# Patient Record
Sex: Male | Born: 1960
Health system: Southern US, Community
[De-identification: ages and names within clinical notes are randomized; demographics above are authoritative.]

## PROBLEM LIST (undated history)

## (undated) DIAGNOSIS — M79606 Pain in leg, unspecified: Secondary | ICD-10-CM

## (undated) DIAGNOSIS — R079 Chest pain, unspecified: Secondary | ICD-10-CM

## (undated) DIAGNOSIS — I10 Essential (primary) hypertension: Secondary | ICD-10-CM

## (undated) DIAGNOSIS — E079 Disorder of thyroid, unspecified: Secondary | ICD-10-CM

## (undated) DIAGNOSIS — E785 Hyperlipidemia, unspecified: Secondary | ICD-10-CM

## (undated) HISTORY — DX: Essential (primary) hypertension: I10

## (undated) HISTORY — PX: KNEE SURGERY: SHX244

## (undated) HISTORY — DX: Hyperlipidemia, unspecified: E78.5

## (undated) HISTORY — DX: Chest pain, unspecified: R07.9

## (undated) HISTORY — DX: Disorder of thyroid, unspecified: E07.9

## (undated) HISTORY — DX: Pain in leg, unspecified: M79.606

---

## 1997-12-25 ENCOUNTER — Other Ambulatory Visit: Admission: RE | Admit: 1997-12-25 | Discharge: 1997-12-25 | Payer: Self-pay | Admitting: Internal Medicine

## 1998-01-09 ENCOUNTER — Other Ambulatory Visit: Admission: RE | Admit: 1998-01-09 | Discharge: 1998-01-09 | Payer: Self-pay | Admitting: *Deleted

## 2005-05-04 ENCOUNTER — Ambulatory Visit: Payer: Self-pay | Admitting: Internal Medicine

## 2005-05-20 ENCOUNTER — Ambulatory Visit: Payer: Self-pay | Admitting: Internal Medicine

## 2005-05-20 ENCOUNTER — Encounter (INDEPENDENT_AMBULATORY_CARE_PROVIDER_SITE_OTHER): Payer: Self-pay | Admitting: *Deleted

## 2006-04-24 ENCOUNTER — Observation Stay (HOSPITAL_COMMUNITY): Admission: EM | Admit: 2006-04-24 | Discharge: 2006-04-24 | Payer: Self-pay | Admitting: Emergency Medicine

## 2009-04-14 ENCOUNTER — Emergency Department (HOSPITAL_COMMUNITY): Admission: EM | Admit: 2009-04-14 | Discharge: 2009-04-14 | Payer: Self-pay | Admitting: Emergency Medicine

## 2010-09-14 ENCOUNTER — Encounter (INDEPENDENT_AMBULATORY_CARE_PROVIDER_SITE_OTHER): Payer: 59 | Admitting: Internal Medicine

## 2010-09-14 DIAGNOSIS — R079 Chest pain, unspecified: Secondary | ICD-10-CM

## 2010-09-14 NOTE — Progress Notes (Deleted)
This encounter was created in error - please disregard.

## 2010-09-14 NOTE — Progress Notes (Signed)
Exercise Treadmill Test  Pre-Exercise Testing Evaluation Rhythm: normal sinus  Rate: 65   PR:  .14 QRS:  .09  QT:  .40 QTc: .42     Test  Exercise Tolerance Test Ordering MD: Lewayne Bunting, MD  Interpreting MD:  Lewayne Bunting, MD  Unique Test No: 1  Treadmill:  1  Indication for ETT: chest pain - rule out ischemia  Contraindication to ETT: No   Stress Modality: exercise - treadmill  Cardiac Imaging Performed: non   Protocol: standard Bruce - maximal  Max BP:  219/85  Max MPHR (bpm):  173 85% MPR (bpm):  147  MPHR obtained (bpm): 155 % MPHR obtained:  89  Reached 85% MPHR (min:sec):  10:20 Total Exercise Time (min-sec):  12:00  Workload in METS:  13.4 Borg Scale: 13  Reason ETT Terminated:  desired heart rate attained    ST Segment Analysis At Rest: normal ST segments - no evidence of significant ST depression With Exercise: no evidence of significant ST depression  Other Information Arrhythmia:  No Angina during ETT:  absent (0) Quality of ETT:  diagnostic  ETT Interpretation:  normal - no evidence of ischemia by ST analysis  Comments: Hypertensive at baseline  Recommendations:

## 2010-09-15 ENCOUNTER — Other Ambulatory Visit: Payer: 59

## 2010-09-15 NOTE — Progress Notes (Deleted)
This encounter was created in error - please disregard.

## 2010-09-22 ENCOUNTER — Telehealth: Payer: Self-pay | Admitting: *Deleted

## 2010-09-22 ENCOUNTER — Other Ambulatory Visit: Payer: 59

## 2010-09-22 ENCOUNTER — Other Ambulatory Visit (INDEPENDENT_AMBULATORY_CARE_PROVIDER_SITE_OTHER): Payer: 59

## 2010-09-22 DIAGNOSIS — R079 Chest pain, unspecified: Secondary | ICD-10-CM

## 2010-09-22 DIAGNOSIS — I10 Essential (primary) hypertension: Secondary | ICD-10-CM

## 2010-09-22 LAB — LIPID PANEL
Cholesterol: 203 mg/dL — ABNORMAL HIGH (ref 0–200)
HDL: 43.3 mg/dL (ref >39.00)
Total CHOL/HDL Ratio: 5
Triglycerides: 100 mg/dL (ref 0.0–149.0)

## 2010-09-22 MED ORDER — AMLODIPINE BESYLATE 5 MG PO TABS: 5.0000 mg | ORAL_TABLET | Freq: Every day | ORAL | Status: DC

## 2010-09-22 NOTE — Telephone Encounter (Signed)
Dr Ladona Ridgel called me this morning and asked me to call in Amlodipine for patient 5mg 

## 2010-09-23 LAB — LDL CHOLESTEROL, DIRECT: Direct LDL: 141.5 mg/dL

## 2010-09-24 LAB — URINALYSIS, ROUTINE W REFLEX MICROSCOPIC
Bilirubin Urine: NEGATIVE
Hgb urine dipstick: NEGATIVE
Ketones, ur: >80 mg/dL — AB
Nitrite: NEGATIVE
pH: 5.5 (ref 5.0–8.0)

## 2010-10-05 ENCOUNTER — Telehealth: Payer: Self-pay | Admitting: Internal Medicine

## 2010-10-05 NOTE — Telephone Encounter (Signed)
Pt aware of cholesterol results and will discuss with Dr Ladona Ridgel  He first wants to try diet as he already exercises

## 2010-10-05 NOTE — Telephone Encounter (Signed)
Pt calling re test results. Pt states he returning the call

## 2010-11-06 NOTE — Op Note (Signed)
NAME:  William Mcmillan, William Mcmillan               ACCOUNT NO.:  [id number]   MEDICAL RECORD NO.:  [id number]          PATIENT TYPE:  OBV   LOCATION:  2550                         FACILITY:  [facility]   PHYSICIAN:  Artist Pais. Weingold, M.D.DATE OF BIRTH:  Dec 22, 1960   DATE OF PROCEDURE:  04/24/2006  DATE OF DISCHARGE:  04/24/2006                                 OPERATIVE REPORT   PREOPERATIVE DIAGNOSIS:  Left knee chain saw laceration.   POSTOPERATIVE DIAGNOSIS:  Left knee chain saw laceration.   PROCEDURE:  Incision and drainage, evacuation of hematoma, repair of medial  retinaculum.   SURGEON:  Artist Pais. Mina Marble, M.D.   ASSISTANT:  None.   ANESTHESIA:  General.   TOURNIQUET TIME:  12 minutes.   COMPLICATIONS:  None.   DRAINS:  None.   DESCRIPTION OF PROCEDURE:  The patient was taken to the operative suite  after the induction of general anesthesia.  Left leg was prepped and draped  in the usual sterile fashion.  At this point in time, laceration was closed  previously in my office 24 hours prior, was opened due to a large hematoma  and the hematoma was evacuated.  Visualization revealed some violation of  the patellar periosteum on the supermedial aspect as well as some laceration  of the medial aspect of the quadriceps musculature in the same area.  There  was no significant active arterial bleeding noted. The wound as thoroughly  irrigated with 3 liters of normal saline. Any devitalized tissue was  debrided.  After this was done, the leg was exsanguinated, the tourniquet  was inflated to 300 mmHg and the vastus medialis musculature and the small  periosteal defect over the patella was repaired using 0 Vicryl and figure-of-  eight sutures times 5.  After this was done, a drain was placed into the  wound exiting to the medial skin.  The wound was then closed in layers of 2-  0 undyed Vicryl and 3-0 nylon on the skin.  Xeroform 4 x 4s, and fluffs and  compressive dressing and a knee  immobilizer were applied.  The patient  tolerated the procedure well and went to recovery in stable fashion.      Artist Pais Mina Marble, M.D.  Electronically Signed     MAW/MEDQ  D:  04/24/2006  T:  04/25/2006  Job:  [id number]

## 2011-05-19 ENCOUNTER — Telehealth: Payer: Self-pay | Admitting: *Deleted

## 2011-05-19 DIAGNOSIS — E7800 Pure hypercholesterolemia, unspecified: Secondary | ICD-10-CM

## 2011-05-19 MED ORDER — ATORVASTATIN CALCIUM 10 MG PO TABS: 10.0000 mg | ORAL_TABLET | Freq: Every day | ORAL | Status: DC

## 2011-05-19 NOTE — Telephone Encounter (Signed)
Dr Ladona Ridgel called this morning and asked that I call in a Rx of this patient  He is also to have fasting lab work in 6 weeks  Spoke with patient and he is aware of the above  Will have labs drawn on 06/30/10 at 8:30am

## 2011-07-01 ENCOUNTER — Other Ambulatory Visit: Payer: 59 | Admitting: *Deleted

## 2011-07-21 ENCOUNTER — Ambulatory Visit (INDEPENDENT_AMBULATORY_CARE_PROVIDER_SITE_OTHER): Payer: BC Managed Care – PPO | Admitting: *Deleted

## 2011-07-21 DIAGNOSIS — E7800 Pure hypercholesterolemia, unspecified: Secondary | ICD-10-CM

## 2011-07-21 LAB — TSH: TSH: 0.09 u[IU]/mL — ABNORMAL LOW (ref 0.35–5.50)

## 2011-07-21 LAB — LIPID PANEL
Cholesterol: 151 mg/dL (ref 0–200)
HDL: 36.6 mg/dL — ABNORMAL LOW (ref >39.00)
VLDL: 27.8 mg/dL (ref 0.0–40.0)

## 2011-07-21 LAB — HEPATIC FUNCTION PANEL
Albumin: 4 g/dL (ref 3.5–5.2)
Total Protein: 6.6 g/dL (ref 6.0–8.3)

## 2011-10-05 ENCOUNTER — Other Ambulatory Visit: Payer: Self-pay | Admitting: Internal Medicine

## 2011-10-11 ENCOUNTER — Other Ambulatory Visit: Payer: Self-pay | Admitting: Internal Medicine

## 2011-10-23 ENCOUNTER — Other Ambulatory Visit: Payer: Self-pay | Admitting: Internal Medicine

## 2011-10-26 ENCOUNTER — Other Ambulatory Visit: Payer: Self-pay | Admitting: Internal Medicine

## 2011-10-28 ENCOUNTER — Other Ambulatory Visit: Payer: Self-pay | Admitting: Internal Medicine

## 2011-10-28 MED ORDER — ATORVASTATIN CALCIUM 10 MG PO TABS: 10.0000 mg | ORAL_TABLET | Freq: Every day | ORAL | Status: DC

## 2011-10-28 MED ORDER — AMLODIPINE BESYLATE 5 MG PO TABS: 5.0000 mg | ORAL_TABLET | Freq: Every day | ORAL | Status: DC

## 2011-10-28 NOTE — Telephone Encounter (Signed)
Pt needs refill of atorvastatin 10mg , amlodipine 5mg  [facility] 90 days

## 2011-11-01 ENCOUNTER — Other Ambulatory Visit: Payer: Self-pay | Admitting: *Deleted

## 2011-11-01 MED ORDER — AMLODIPINE BESYLATE 5 MG PO TABS: 5.0000 mg | ORAL_TABLET | Freq: Every day | ORAL | Status: DC

## 2011-11-01 MED ORDER — ATORVASTATIN CALCIUM 10 MG PO TABS: 10.0000 mg | ORAL_TABLET | Freq: Every day | ORAL | Status: DC

## 2011-11-01 NOTE — Telephone Encounter (Signed)
Refill completed.

## 2012-05-07 ENCOUNTER — Other Ambulatory Visit: Payer: Self-pay | Admitting: Internal Medicine

## 2012-07-09 ENCOUNTER — Other Ambulatory Visit: Payer: Self-pay | Admitting: Internal Medicine

## 2012-11-24 ENCOUNTER — Ambulatory Visit: Payer: Self-pay | Admitting: Internal Medicine

## 2012-11-24 ENCOUNTER — Telehealth: Payer: Self-pay

## 2012-11-24 ENCOUNTER — Encounter: Payer: Self-pay | Admitting: Internal Medicine

## 2012-11-24 ENCOUNTER — Ambulatory Visit (INDEPENDENT_AMBULATORY_CARE_PROVIDER_SITE_OTHER): Payer: BC Managed Care – PPO | Admitting: Internal Medicine

## 2012-11-24 VITALS — BP 120/82 | HR 80 | Ht 69.0 in | Wt 152.8 lb

## 2012-11-24 DIAGNOSIS — R0789 Other chest pain: Secondary | ICD-10-CM

## 2012-11-24 DIAGNOSIS — M79606 Pain in leg, unspecified: Secondary | ICD-10-CM

## 2012-11-24 DIAGNOSIS — R079 Chest pain, unspecified: Secondary | ICD-10-CM

## 2012-11-24 DIAGNOSIS — M79605 Pain in left leg: Secondary | ICD-10-CM

## 2012-11-24 DIAGNOSIS — M79609 Pain in unspecified limb: Secondary | ICD-10-CM

## 2012-11-24 DIAGNOSIS — E785 Hyperlipidemia, unspecified: Secondary | ICD-10-CM | POA: Insufficient documentation

## 2012-11-24 DIAGNOSIS — I10 Essential (primary) hypertension: Secondary | ICD-10-CM

## 2012-11-24 HISTORY — DX: Essential (primary) hypertension: I10

## 2012-11-24 HISTORY — DX: Hyperlipidemia, unspecified: E78.5

## 2012-11-24 HISTORY — DX: Chest pain, unspecified: R07.9

## 2012-11-24 HISTORY — DX: Pain in leg, unspecified: M79.606

## 2012-11-24 NOTE — Assessment & Plan Note (Signed)
The patient will continue his statin therapy.

## 2012-11-24 NOTE — Progress Notes (Signed)
HPI William Mcmillan returns today for followup. He is a 52 year old man with a history of hypertension, dyslipidemia, and thyroid dysfunction. He has a family history of heart disease and had a negative stress test several years ago. 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 A cow stepped on his calf.  A large swollen area developed. He did not seek medical attention. Over the last 2 weeks, the area of swelling in his left lower leg has migrated downward into the area of the ankle such that his ankle remains swollen. Approximately 2 days ago, he began to experience intermittent chest tightness. He notes that he is actually more sore and tight. He does not appear to be pleuritic in nature. 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 He has not had syncope. No relationship to position. He denies fevers or chills. No Known Allergies   Current Outpatient Prescriptions  Medication Sig Dispense Refill  . amLODipine (NORVASC) 5 MG tablet TAKE 1 TABLET (5 MG TOTAL) BY MOUTH DAILY.  30 tablet  1  . atorvastatin (LIPITOR) 10 MG tablet TAKE 1 TABLET (10 MG TOTAL) BY MOUTH DAILY.  30 tablet  1  . levothyroxine (SYNTHROID, LEVOTHROID) 100 MCG tablet Take 100 mcg by mouth. Takes 100 mg 2 tablets daily a week.      . levothyroxine (SYNTHROID, LEVOTHROID) 88 MCG tablet Take 88 mcg by mouth. Takes 88 mcg daily five times a week.      Marland Kitchen liothyronine (CYTOMEL) 25 MCG tablet Take 12.5 mcg by mouth daily.       No current facility-administered medications for this visit.     Past Medical History  Diagnosis Date  . Hypertension   . Thyroid disease     hypo    ROS:   All systems reviewed and negative except as noted in the HPI.   Past Surgical History  Procedure Laterality Date  . Knee surgery       Family History  Problem Relation Age of Onset  . Heart disease Mother   . Heart disease Father   . Heart attack Father      History    Social History  . Marital Status: Single    Spouse Name: N/A    Number of Children: N/A  . Years of Education: N/A   Occupational History  . Not on file.   Social History Main Topics  . Smoking status: Never Smoker   . Smokeless tobacco: Not on file  . Alcohol Use: No     Comment: occassional  . Drug Use: No  . Sexually Active: Not on file   Other Topics Concern  . Not on file   Social History Narrative  . No narrative on file     BP 120/82  Pulse 80  Ht 5\' 9"  (1.753 m)  Wt 152 lb 12 oz (69.287 kg)  BMI 22.55 kg/m2  Physical Exam:  Well appearing middle-aged man,NAD HEENT: Unremarkable Neck:  No JVD, no thyromegally Back:  No CVA tenderness Lungs:  Clear with no wheezes, rales, or rhonchi. HEART:  Regular rate rhythm, no murmurs, no rubs, no clicks Abd:  soft, positive bowel sounds, no organomegally, no rebound, no guarding Ext:  2 plus pulses, the left lower leg is swollen with minimal erythema. Skin:  No rashes no nodules Neuro:  CN II through XII intact, motor grossly intact  EKG - normal sinus rhythm with rightward axis  and right atrial enlargement. No old ECGs to compare.   Assess/Plan:

## 2012-11-24 NOTE — Addendum Note (Signed)
Addended by: Sabino Snipes E on: 11/24/2012 02:30 PM   Modules accepted: Orders

## 2012-11-24 NOTE — Telephone Encounter (Signed)
Per U/S, negative for dvt Per CT, "small indeterminate nodule in RUL, otherwise negative for PE" Dr. Ladona Ridgel was informed He will inform pt

## 2012-11-24 NOTE — Assessment & Plan Note (Signed)
The patient has very atypical chest pain. We will plan to undergo CT angiography to rule out an occult pulmonary embolism. His ECG shows a rightward axis which would be consistent with this diagnosis. On the other hand, he is not short of breath. He is either a pulmonary embolism or DVT is found, I would anticipate initiation of anticoagulation.

## 2012-11-24 NOTE — Telephone Encounter (Signed)
I made Dr. Ladona Ridgel aware of appts at [facility] this PM at 3:00 pm He will see pt at 1:30 then have pt go to [facility] for testing

## 2012-11-24 NOTE — Assessment & Plan Note (Signed)
The patient has residual swelling in his left lower leg which tracks all the way down to his ankle which is also swollen. There is minimal if any tenderness. I am concerned about a DVT. Will undertake left leg ultrasound to rule out DVT secondary to prior left leg trauma and swelling.

## 2012-11-24 NOTE — Telephone Encounter (Signed)
"  schedule pt for CT chest with contrast and venous U/S of LE for dx:sob and leg pain.  He is to see [place] in clinic at 1:30 pm today" V.O.Dr. Barbette Reichmann, RN, BSN

## 2012-11-24 NOTE — Patient Instructions (Addendum)
Go to [facility] for ultrasound of left leg and CT chest   Your physician wants you to follow-up as needed with Dr. Ladona Ridgel. You will receive a reminder letter in the mail two months in advance. If you don't receive a letter, please call our office to schedule the follow-up appointment.

## 2013-08-14 ENCOUNTER — Other Ambulatory Visit: Payer: Self-pay | Admitting: Internal Medicine

## 2013-10-30 ENCOUNTER — Other Ambulatory Visit: Payer: Self-pay

## 2013-10-30 MED ORDER — AMLODIPINE BESYLATE 5 MG PO TABS: ORAL_TABLET | ORAL | Status: DC

## 2013-10-30 MED ORDER — ATORVASTATIN CALCIUM 10 MG PO TABS: ORAL_TABLET | ORAL | Status: DC

## 2014-10-29 ENCOUNTER — Other Ambulatory Visit: Payer: Self-pay | Admitting: Internal Medicine

## 2014-10-30 ENCOUNTER — Other Ambulatory Visit: Payer: Self-pay | Admitting: *Deleted

## 2014-10-30 MED ORDER — AMLODIPINE BESYLATE 5 MG PO TABS: ORAL_TABLET | ORAL | Status: DC

## 2014-11-07 ENCOUNTER — Other Ambulatory Visit: Payer: Self-pay | Admitting: Internal Medicine

## 2015-01-09 ENCOUNTER — Other Ambulatory Visit: Payer: Self-pay | Admitting: Internal Medicine

## 2015-02-06 ENCOUNTER — Other Ambulatory Visit: Payer: Self-pay | Admitting: Internal Medicine

## 2015-02-07 ENCOUNTER — Other Ambulatory Visit: Payer: Self-pay

## 2015-02-19 ENCOUNTER — Encounter: Payer: Self-pay | Admitting: Internal Medicine

## 2015-02-25 ENCOUNTER — Other Ambulatory Visit: Payer: Self-pay | Admitting: *Deleted

## 2015-02-25 DIAGNOSIS — I10 Essential (primary) hypertension: Secondary | ICD-10-CM

## 2015-02-25 DIAGNOSIS — E785 Hyperlipidemia, unspecified: Secondary | ICD-10-CM

## 2015-02-25 MED ORDER — ATORVASTATIN CALCIUM 10 MG PO TABS: 10.0000 mg | ORAL_TABLET | Freq: Every day | ORAL | Status: DC

## 2015-02-25 MED ORDER — AMLODIPINE BESYLATE 5 MG PO TABS: ORAL_TABLET | ORAL | Status: DC

## 2015-02-25 NOTE — Telephone Encounter (Signed)
Dr Ladona Ridgel has okayed to fill.  Patient to come in for labs soon

## 2015-02-27 ENCOUNTER — Other Ambulatory Visit (INDEPENDENT_AMBULATORY_CARE_PROVIDER_SITE_OTHER): Payer: Managed Care, Other (non HMO) | Admitting: *Deleted

## 2015-02-27 DIAGNOSIS — E785 Hyperlipidemia, unspecified: Secondary | ICD-10-CM | POA: Diagnosis not present

## 2015-02-27 DIAGNOSIS — I10 Essential (primary) hypertension: Secondary | ICD-10-CM | POA: Diagnosis not present

## 2015-02-27 LAB — BASIC METABOLIC PANEL
BUN: 20 mg/dL (ref 6–23)
CHLORIDE: 102 meq/L (ref 96–112)
CO2: 31 meq/L (ref 19–32)
CREATININE: 1.41 mg/dL (ref 0.40–1.50)
Calcium: 9.9 mg/dL (ref 8.4–10.5)
GFR: 56.16 mL/min — ABNORMAL LOW (ref >60.00)
Glucose, Bld: 104 mg/dL — ABNORMAL HIGH (ref 70–99)
POTASSIUM: 4.6 meq/L (ref 3.5–5.1)
SODIUM: 139 meq/L (ref 135–145)

## 2015-02-27 LAB — HEPATIC FUNCTION PANEL
ALBUMIN: 4.5 g/dL (ref 3.5–5.2)
ALT: 23 U/L (ref 0–53)
AST: 27 U/L (ref 0–37)
Alkaline Phosphatase: 72 U/L (ref 39–117)
Bilirubin, Direct: 0.1 mg/dL (ref 0.0–0.3)
Total Bilirubin: 0.7 mg/dL (ref 0.2–1.2)
Total Protein: 7.1 g/dL (ref 6.0–8.3)

## 2015-02-27 LAB — LIPID PANEL
CHOLESTEROL: 185 mg/dL (ref 0–200)
HDL: 51.7 mg/dL (ref >39.00)
LDL Cholesterol: 106 mg/dL — ABNORMAL HIGH (ref 0–99)
NonHDL: 133.6
Total CHOL/HDL Ratio: 4
Triglycerides: 139 mg/dL (ref 0.0–149.0)
VLDL: 27.8 mg/dL (ref 0.0–40.0)

## 2015-02-28 ENCOUNTER — Other Ambulatory Visit: Payer: Self-pay

## 2015-02-28 MED ORDER — AMLODIPINE BESYLATE 5 MG PO TABS: 5.0000 mg | ORAL_TABLET | Freq: Every day | ORAL | Status: DC

## 2015-02-28 MED ORDER — ATORVASTATIN CALCIUM 10 MG PO TABS: 10.0000 mg | ORAL_TABLET | Freq: Every day | ORAL | Status: DC

## 2016-03-01 ENCOUNTER — Other Ambulatory Visit: Payer: Self-pay | Admitting: Internal Medicine

## 2016-03-03 NOTE — Telephone Encounter (Signed)
Send to PCP.

## 2016-03-03 NOTE — Telephone Encounter (Signed)
Deliah BostonKelly F Lanier, RN routed conversation to You 30 minutes ago (4:08 PM)    You  Deliah BostonKelly F Lanier, RN 3 hours ago (12:51 PM)    PRN PT WITH DR Ladona RidgelAYLOR, HE HASNT BEEN SEEN SINCE 2014, WANTS ATORVASTATIN, SHOULD HE GO TO HIS PCP? PLEASE ADVISE, THANKS  (Routing comment)     Deliah BostonKelly F Lanier, RN routed conversation to You 5 hours ago (11:12 AM)    Deliah BostonKelly F Lanier, RN 5 hours ago (11:12 AM)    Send to PCP   Documentation     You  Deliah BostonKelly F Lanier, RN Yesterday (11:09 AM)    ASKING FOR LIPITOR REFILL, THEY HAVENT BEEN SEEN IN 3 YEARS, I KNOW THEY ARE PRN BUT WOULDN'T THEY STILL NEED BLOOD WORK ETC? PLEASE ADVISE (Routing comment)     Interface, Surescripts Out routed [facility] to [facility] 2 days

## 2016-03-08 ENCOUNTER — Other Ambulatory Visit: Payer: Self-pay | Admitting: Internal Medicine

## 2016-03-08 NOTE — Telephone Encounter (Signed)
Please send to PCP as he has not been seen in 3 years

## 2016-03-09 ENCOUNTER — Other Ambulatory Visit: Payer: Self-pay | Admitting: *Deleted

## 2016-03-09 MED ORDER — ATORVASTATIN CALCIUM 10 MG PO TABS: 10.0000 mg | ORAL_TABLET | Freq: Every day | ORAL | 3 refills | Status: DC

## 2016-05-15 ENCOUNTER — Other Ambulatory Visit: Payer: Self-pay | Admitting: Internal Medicine

## 2016-09-02 ENCOUNTER — Encounter: Payer: Self-pay | Admitting: Internal Medicine

## 2016-09-08 ENCOUNTER — Ambulatory Visit (INDEPENDENT_AMBULATORY_CARE_PROVIDER_SITE_OTHER)
Admission: RE | Admit: 2016-09-08 | Discharge: 2016-09-08 | Disposition: A | Discharge status: Home / Self Care | Payer: Managed Care, Other (non HMO) | Source: Ambulatory Visit | Attending: Internal Medicine | Admitting: Internal Medicine

## 2016-09-08 ENCOUNTER — Ambulatory Visit (INDEPENDENT_AMBULATORY_CARE_PROVIDER_SITE_OTHER): Payer: Managed Care, Other (non HMO) | Admitting: Internal Medicine

## 2016-09-08 ENCOUNTER — Encounter: Payer: Self-pay | Admitting: Internal Medicine

## 2016-09-08 ENCOUNTER — Encounter (INDEPENDENT_AMBULATORY_CARE_PROVIDER_SITE_OTHER): Payer: Self-pay

## 2016-09-08 VITALS — BP 132/84 | HR 60 | Ht 69.0 in | Wt 165.4 lb

## 2016-09-08 DIAGNOSIS — I10 Essential (primary) hypertension: Secondary | ICD-10-CM

## 2016-09-08 DIAGNOSIS — R079 Chest pain, unspecified: Secondary | ICD-10-CM

## 2016-09-08 DIAGNOSIS — E785 Hyperlipidemia, unspecified: Secondary | ICD-10-CM

## 2016-09-08 NOTE — Progress Notes (Signed)
HPI [name] returns today for followup. He is a 56 year old man with a history of hypertension, dyslipidemia, and thyroid dysfunction. He has a family history of heart disease and had a negative stress test several years ago. He has non-cardiac chest pain.  He has not had syncope. No relationship to position. He denies fevers or chills. He has a father with CAD, s/p CABG and a brother with prior MI/s/p stenting.  No Known Allergies   Current Outpatient Prescriptions  Medication Sig Dispense Refill  . amLODipine (NORVASC) 5 MG tablet TAKE 1 TABLET (5 MG TOTAL) BY MOUTH DAILY. 90 tablet 1  . atorvastatin (LIPITOR) 10 MG tablet Take 1 tablet (10 mg total) by mouth daily. 90 tablet 3  . levothyroxine (SYNTHROID, LEVOTHROID) 100 MCG tablet Take 100 mcg by mouth. Takes 100 mg 2 tablets daily a week.    . levothyroxine (SYNTHROID, LEVOTHROID) 88 MCG tablet Take 88 mcg by mouth. Takes 88 mcg daily five times a week.    Marland Kitchen. liothyronine (CYTOMEL) 25 MCG tablet Take 12.5 mcg by mouth daily.     No current facility-administered medications for this visit.      Past Medical History:  Diagnosis Date  . Hypertension   . Thyroid disease    hypo    ROS:   All systems reviewed and negative except as noted in the HPI.   Past Surgical History:  Procedure Laterality Date  . KNEE SURGERY       Family History  Problem Relation Age of Onset  . Heart disease Mother   . Heart disease Father   . Heart attack Father      Social History   Social History  . Marital status: Single    Spouse name: N/A  . Number of children: N/A  . Years of education: N/A   Occupational History  . Not on file.   Social History Main Topics  . Smoking status: Former Smoker    Packs/day: 0.50    Years: 2.00    Types: Cigarettes    Quit date: 06/21/1980  . Smokeless tobacco: Former NeurosurgeonUser    Types: Snuff    Quit date: 06/21/1980  . Alcohol use No     Comment: occassional  . Drug use: No  . Sexual activity:  Not on file   Other Topics Concern  . Not on file   Social History Narrative  . No narrative on file     BP 132/84   Pulse 60   Ht 5\' 9"  (1.753 m)   Wt 165 lb 6.4 oz (75 kg)   SpO2 98%   BMI 24.43 kg/m   Physical Exam:  Well appearing middle-aged man,NAD HEENT: Unremarkable Neck:  6 cm JVD, no thyromegally Back:  No CVA tenderness Lungs:  Clear with no wheezes, rales, or rhonchi. HEART:  Regular rate rhythm, no murmurs, no rubs, no clicks Abd:  soft, positive bowel sounds, no organomegally, no rebound, no guarding Ext:  2 plus pulses, the left lower leg is swollen with minimal erythema. Skin:  No rashes no nodules Neuro:  CN II through XII intact, motor grossly intact  EKG - normal sinus rhythm with rightward axis and non-specific STT changes.  Assess/Plan: 1. Non-cardiac chest pain with multiple cardiac risk factors - will have the patient undergo coronary calcium score to quantitate his coronary calcium. No additional recs pending the result of the CT scan 2. HTN - his blood pressure is mostly well controlled with amlodipine.  3. Dyslipidemia -  His fasting lipids have not been check in a couple of years. Will recheck in the next week or so.  Leonia Reeves.D.

## 2016-09-08 NOTE — Patient Instructions (Addendum)
Medication Instructions:  Your physician recommends that you continue on your current medications as directed. Please refer to the Current Medication list given to you today.   Labwork: None orered  Testing/Procedures: Dr. Ladona Ridgelaylor wants you to have CT Calcium Scoring to be done at the [address] location.  Follow-Up: Based on results of the CT Calcium Scoring results.   Any Other Special Instructions Will Be Listed Below (If Applicable).     If you need a refill on your cardiac medications before your next appointment, please call your pharmacy.

## 2016-09-13 ENCOUNTER — Other Ambulatory Visit: Payer: Self-pay | Admitting: *Deleted

## 2016-09-13 DIAGNOSIS — E785 Hyperlipidemia, unspecified: Secondary | ICD-10-CM

## 2016-09-13 DIAGNOSIS — R079 Chest pain, unspecified: Secondary | ICD-10-CM

## 2016-09-14 ENCOUNTER — Other Ambulatory Visit: Payer: Managed Care, Other (non HMO)

## 2016-09-14 ENCOUNTER — Ambulatory Visit: Payer: Managed Care, Other (non HMO)

## 2016-09-14 DIAGNOSIS — E785 Hyperlipidemia, unspecified: Secondary | ICD-10-CM

## 2016-09-14 DIAGNOSIS — R079 Chest pain, unspecified: Secondary | ICD-10-CM

## 2016-09-14 LAB — EXERCISE TOLERANCE TEST
CHL CUP STRESS STAGE 1 DBP: 84 mmHg
CHL CUP STRESS STAGE 1 GRADE: 0 %
CHL CUP STRESS STAGE 1 HR: 81 {beats}/min
CHL CUP STRESS STAGE 1 SPEED: 0 mph
CHL CUP STRESS STAGE 10 HR: 115 {beats}/min
CHL CUP STRESS STAGE 10 SPEED: 1.5 mph
CHL CUP STRESS STAGE 11 HR: 80 {beats}/min
CHL CUP STRESS STAGE 2 HR: 82 {beats}/min
CHL CUP STRESS STAGE 3 GRADE: 0 %
CHL CUP STRESS STAGE 3 SPEED: 1 mph
CHL CUP STRESS STAGE 4 SPEED: 1 mph
CHL CUP STRESS STAGE 5 GRADE: 10 %
CHL CUP STRESS STAGE 5 HR: 82 {beats}/min
CHL CUP STRESS STAGE 6 HR: 104 {beats}/min
CHL CUP STRESS STAGE 6 SBP: 154 mmHg
CHL CUP STRESS STAGE 6 SPEED: 2.5 mph
CHL CUP STRESS STAGE 7 DBP: 75 mmHg
CHL CUP STRESS STAGE 7 HR: 127 {beats}/min
CHL CUP STRESS STAGE 7 SBP: 195 mmHg
CHL CUP STRESS STAGE 8 DBP: 83 mmHg
CHL CUP STRESS STAGE 8 GRADE: 16 %
CHL CUP STRESS STAGE 9 HR: 141 {beats}/min
CHL RATE OF PERCEIVED EXERTION: 13
CSEPED: 12 min
CSEPEDS: 0 s
CSEPEW: 13.4 METS
CSEPHR: 86 %
CSEPPHR: 141 {beats}/min
CSEPPMHR: 84 %
MPHR: 167 {beats}/min
Rest HR: 67 {beats}/min
Stage 1 SBP: 154 mmHg
Stage 10 DBP: 79 mmHg
Stage 10 Grade: 0 %
Stage 10 SBP: 157 mmHg
Stage 11 DBP: 87 mmHg
Stage 11 Grade: 0 %
Stage 11 SBP: 136 mmHg
Stage 11 Speed: 0 mph
Stage 2 Grade: 0 %
Stage 2 Speed: 0 mph
Stage 3 HR: 83 {beats}/min
Stage 4 Grade: 0.1 %
Stage 4 HR: 82 {beats}/min
Stage 5 DBP: 81 mmHg
Stage 5 SBP: 156 mmHg
Stage 5 Speed: 1.7 mph
Stage 6 DBP: 64 mmHg
Stage 6 Grade: 12 %
Stage 7 Grade: 14 %
Stage 7 Speed: 3.4 mph
Stage 8 HR: 141 {beats}/min
Stage 8 SBP: 220 mmHg
Stage 8 Speed: 4.2 mph
Stage 9 Grade: 16 %
Stage 9 Speed: 4.2 mph

## 2016-09-14 LAB — LIPID PANEL
CHOL/HDL RATIO: 3.8 ratio (ref 0.0–5.0)
Cholesterol, Total: 176 mg/dL (ref 100–199)
HDL: 46 mg/dL (ref >39)
LDL Calculated: 98 mg/dL (ref 0–99)
TRIGLYCERIDES: 158 mg/dL — AB (ref 0–149)
VLDL Cholesterol Cal: 32 mg/dL (ref 5–40)

## 2016-09-15 ENCOUNTER — Ambulatory Visit: Payer: Managed Care, Other (non HMO) | Admitting: Internal Medicine

## 2016-09-15 NOTE — Addendum Note (Signed)
Addended by: Jama FlavorsMANNING, Malyah Ohlrich L on: 09/15/2016 10:55 AM   Modules accepted: Orders

## 2016-09-20 ENCOUNTER — Telehealth: Payer: Self-pay | Admitting: *Deleted

## 2016-09-20 MED ORDER — ATORVASTATIN CALCIUM 20 MG PO TABS: 20.0000 mg | ORAL_TABLET | Freq: Every day | ORAL | 6 refills | Status: DC

## 2016-09-20 MED ORDER — AMLODIPINE BESYLATE 5 MG PO TABS: 5.0000 mg | ORAL_TABLET | Freq: Every day | ORAL | 1 refills | Status: DC

## 2016-09-20 NOTE — Telephone Encounter (Signed)
Reviewed results with patient who verbalized understanding. Rx sent to [facility] per pt request.

## 2016-09-20 NOTE — Telephone Encounter (Signed)
-----   Message from Marinus Maw, MD sent at 09/16/2016 10:34 PM EDT ----- Ask patient to increase his lipitor to 20 mg daily when he gets back from being out of town. GT ----- Message ----- From: Nell Range Lab Results In Sent: 09/14/2016   4:40 PM To: Marinus Maw, MD

## 2016-09-20 NOTE — Addendum Note (Signed)
Addended by: Baird Lyons on: 09/20/2016 01:30 PM   Modules accepted: Orders

## 2016-09-21 ENCOUNTER — Encounter: Payer: Managed Care, Other (non HMO) | Admitting: Internal Medicine

## 2016-09-29 ENCOUNTER — Encounter: Payer: Managed Care, Other (non HMO) | Admitting: Physician Assistant

## 2016-09-29 ENCOUNTER — Telehealth: Payer: Self-pay | Admitting: *Deleted

## 2016-09-29 NOTE — Telephone Encounter (Signed)
DISCUSSED  CARDIAC CT FINDINGS WITH  DR  Ladona Ridgel  NO F/U NEEDED AT THIS TIME  BUT  NEED TO  SWITCH  LIPITOR  10 MG TO CRESTOR  20 MG  DAILY  PT ON VACATION  WILL NEED TO  CALL  LATER  WITH  RECOMMENDATIONS .Zack Seal

## 2016-10-26 ENCOUNTER — Telehealth: Payer: Self-pay | Admitting: *Deleted

## 2016-10-26 ENCOUNTER — Telehealth: Payer: Self-pay | Admitting: Internal Medicine

## 2016-10-26 MED ORDER — ROSUVASTATIN CALCIUM 20 MG PO TABS: 20.0000 mg | ORAL_TABLET | Freq: Every day | ORAL | 6 refills | Status: DC

## 2016-10-26 MED ORDER — ATORVASTATIN CALCIUM 20 MG PO TABS: 20.0000 mg | ORAL_TABLET | Freq: Every day | ORAL | 3 refills | Status: DC

## 2016-10-26 NOTE — Telephone Encounter (Signed)
Changed from Lipitor to Crestor, see 09/29/16 phone note

## 2016-10-26 NOTE — Telephone Encounter (Signed)
New message        *STAT* If patient is at the pharmacy, call can be transferred to refill team.   1. Which medications need to be refilled? (please list name of each medication and dose if known) atorvastatin 20mg  (new dosage)  2. Which pharmacy/location (including street and city if local pharmacy) is medication to be sent to? [facility] 3. Do they need a 30 day or 90 day supply? 90 day supply

## 2016-10-26 NOTE — Telephone Encounter (Signed)
Follow up ° °Pt voiced returning nurses call. ° °Please f/u °

## 2016-10-26 NOTE — Telephone Encounter (Signed)
Advised of recommendation. Rx sent to [facility]. Patient verbalized understanding and agreeable to plan.

## 2016-11-14 ENCOUNTER — Other Ambulatory Visit: Payer: Self-pay | Admitting: Internal Medicine

## 2016-11-16 NOTE — Telephone Encounter (Signed)
Medication Detail    Disp Refills Start End   amLODipine (NORVASC) 5 MG tablet 90 tablet 1 09/20/2016    Sig - Route: Take 1 tablet (5 mg total) by mouth daily. - Oral   Notes to Pharmacy: Patient does not need filled at this time.   E-Prescribing Status: Receipt confirmed by pharmacy (09/20/2016 1:27 PM EDT)   Pharmacy   [id number] - [place], [place] - [address]    

## 2016-11-20 ENCOUNTER — Other Ambulatory Visit: Payer: Self-pay | Admitting: Internal Medicine

## 2016-11-22 ENCOUNTER — Telehealth: Payer: Self-pay

## 2016-11-22 MED ORDER — AMLODIPINE BESYLATE 5 MG PO TABS: 5.0000 mg | ORAL_TABLET | Freq: Every day | ORAL | 11 refills | Status: DC

## 2016-11-22 NOTE — Telephone Encounter (Signed)
[facility]mloidpine [facility] [address], per Dr. Lubertha Basqueaylor's order.

## 2016-11-22 NOTE — Telephone Encounter (Signed)
Medication Detail    Disp Refills Start End   amLODipine (NORVASC) 5 MG tablet 90 tablet 1 09/20/2016    Sig - Route: Take 1 tablet (5 mg total) by mouth daily. - Oral   Notes to Pharmacy: Patient does not need filled at this time.   E-Prescribing Status: Receipt confirmed by pharmacy (09/20/2016 1:27 PM EDT)   Pharmacy   [id number] - [place], [place] - [address]

## 2016-12-19 ENCOUNTER — Encounter (HOSPITAL_COMMUNITY): Payer: Self-pay | Admitting: Emergency Medicine

## 2016-12-19 ENCOUNTER — Emergency Department (HOSPITAL_COMMUNITY)
Admission: EM | Admit: 2016-12-19 | Discharge: 2016-12-19 | Disposition: A | Discharge status: Home / Self Care | Payer: Managed Care, Other (non HMO) | Attending: Emergency Medicine | Admitting: Emergency Medicine

## 2016-12-19 ENCOUNTER — Emergency Department (HOSPITAL_COMMUNITY): Payer: Managed Care, Other (non HMO)

## 2016-12-19 DIAGNOSIS — Z79899 Other long term (current) drug therapy: Secondary | ICD-10-CM | POA: Diagnosis not present

## 2016-12-19 DIAGNOSIS — Z87891 Personal history of nicotine dependence: Secondary | ICD-10-CM | POA: Insufficient documentation

## 2016-12-19 DIAGNOSIS — E039 Hypothyroidism, unspecified: Secondary | ICD-10-CM | POA: Insufficient documentation

## 2016-12-19 DIAGNOSIS — I10 Essential (primary) hypertension: Secondary | ICD-10-CM | POA: Insufficient documentation

## 2016-12-19 DIAGNOSIS — N201 Calculus of ureter: Secondary | ICD-10-CM | POA: Diagnosis not present

## 2016-12-19 DIAGNOSIS — R1031 Right lower quadrant pain: Secondary | ICD-10-CM | POA: Diagnosis present

## 2016-12-19 LAB — URINALYSIS, ROUTINE W REFLEX MICROSCOPIC
Bilirubin Urine: NEGATIVE
Glucose, UA: NEGATIVE mg/dL
HGB URINE DIPSTICK: NEGATIVE
Ketones, ur: 20 mg/dL — AB
Leukocytes, UA: NEGATIVE
NITRITE: NEGATIVE
PH: 5 (ref 5.0–8.0)
Protein, ur: NEGATIVE mg/dL
SPECIFIC GRAVITY, URINE: 1.024 (ref 1.005–1.030)

## 2016-12-19 MED ORDER — ONDANSETRON 8 MG PO TBDP: 8.0000 mg | ORAL_TABLET | Freq: Three times a day (TID) | ORAL | 0 refills | Status: DC | PRN

## 2016-12-19 MED ORDER — KETOROLAC TROMETHAMINE 30 MG/ML IJ SOLN
30.0000 mg | Freq: Once | INTRAMUSCULAR | Status: AC
  Administered 2016-12-19: 30 mg via INTRAVENOUS
  Filled 2016-12-19: qty 1

## 2016-12-19 MED ORDER — TAMSULOSIN HCL 0.4 MG PO CAPS: 0.4000 mg | ORAL_CAPSULE | Freq: Every day | ORAL | 0 refills | Status: DC

## 2016-12-19 MED ORDER — HYDROMORPHONE HCL 1 MG/ML IJ SOLN
1.0000 mg | Freq: Once | INTRAMUSCULAR | Status: AC
  Administered 2016-12-19: 1 mg via INTRAVENOUS
  Filled 2016-12-19: qty 1

## 2016-12-19 MED ORDER — OXYCODONE-ACETAMINOPHEN 5-325 MG PO TABS: 1.0000 | ORAL_TABLET | ORAL | 0 refills | Status: DC | PRN

## 2016-12-19 MED ORDER — ONDANSETRON HCL 4 MG/2ML IJ SOLN
4.0000 mg | Freq: Once | INTRAMUSCULAR | Status: AC
  Administered 2016-12-19: 4 mg via INTRAVENOUS
  Filled 2016-12-19: qty 2

## 2016-12-19 MED ORDER — IBUPROFEN 600 MG PO TABS: 600.0000 mg | ORAL_TABLET | Freq: Three times a day (TID) | ORAL | 0 refills | Status: DC | PRN

## 2016-12-19 NOTE — ED Provider Notes (Signed)
[facility]EMERGENCY DEPT Provider Note   CSN: [id number] Arrival date & time: 12/19/16  1913     History   Chief Complaint Chief Complaint  Patient presents with  . Flank Pain    HPI William Mcmillan is a 56 y.o. male.  HPI Patient is a 56 year old male who presents the emergency department with acute severe right flank pain with radiation towards his right groin.  He has a history kidney stones and states this feels similar.  His pain is severe in severity.  Reports nausea without vomiting.  Denies diarrhea.  No fevers or chills.  No urinary complaints.  He was in his normal state health earlier today   Past Medical History:  Diagnosis Date  . Hypertension   . Thyroid disease    hypo    Patient Active Problem List   Diagnosis Date Noted  . Chest pain 11/24/2012  . Leg pain 11/24/2012  . Essential hypertension 11/24/2012  . Dyslipidemia 11/24/2012    Past Surgical History:  Procedure Laterality Date  . KNEE SURGERY         Home Medications    Prior to Admission medications   Medication Sig Start Date End Date Taking? Authorizing Provider  amLODipine (NORVASC) 5 MG tablet Take 1 tablet (5 mg total) by mouth daily. 11/22/16  Yes Marinus Maw, MD  Artificial Tear Ointment (DRY EYES OP) Apply 1 drop to eye as needed (dry eye).   Yes [provider]  levothyroxine (SYNTHROID, LEVOTHROID) 100 MCG tablet Take 100 mcg by mouth. Takes 100 mg 2 tablets daily a week.   Yes [provider]  liothyronine (CYTOMEL) 25 MCG tablet Take 12.5 mcg by mouth daily.   Yes [provider]  rosuvastatin (CRESTOR) 20 MG tablet Take 1 tablet (20 mg total) by mouth daily. 10/26/16  Yes Marinus Maw, MD  ibuprofen (ADVIL,MOTRIN) 600 MG tablet Take 1 tablet (600 mg total) by mouth every 8 (eight) hours as needed. 12/19/16   Azalia Bilis, MD  ondansetron (ZOFRAN ODT) 8 MG disintegrating tablet Take 1 tablet (8 mg total) by mouth every 8 (eight) hours as needed for nausea  or vomiting. 12/19/16   Azalia Bilis, MD  oxyCODONE-acetaminophen (PERCOCET/ROXICET) 5-325 MG tablet Take 1 tablet by mouth every 4 (four) hours as needed for severe pain. 12/19/16   Azalia Bilis, MD  tamsulosin (FLOMAX) 0.4 MG CAPS capsule Take 1 capsule (0.4 mg total) by mouth daily. 12/19/16   Azalia Bilis, MD    Family History Family History  Problem Relation Age of Onset  . Heart disease Mother   . Heart disease Father   . Heart attack Father     Social History Social History  Substance Use Topics  . Smoking status: Former Smoker    Packs/day: 0.50    Years: 2.00    Types: Cigarettes    Quit date: 06/21/1980  . Smokeless tobacco: Former Neurosurgeon    Types: Snuff    Quit date: 06/21/1980  . Alcohol use No     Comment: occassional     Allergies   Patient has no known allergies.   Review of Systems Review of Systems  All other systems reviewed and are negative.    Physical Exam Updated Vital Signs BP (!) 157/91   Pulse 69   Temp 97.6 F (36.4 C) (Oral)   Resp (!) 22   Ht 5\' 9"  (1.753 m)   Wt 70.3 kg (155 lb)   SpO2 99%   BMI 22.89  kg/m   Physical Exam  Constitutional: He is oriented to person, place, and time. He appears well-developed and well-nourished.  Uncomfortable appearing  HENT:  Head: Normocephalic and atraumatic.  Eyes: EOM are normal.  Neck: Normal range of motion.  Cardiovascular: Normal rate and regular rhythm.   Pulmonary/Chest: Effort normal and breath sounds normal. No respiratory distress.  Abdominal: Soft. He exhibits no distension. There is no tenderness.  Musculoskeletal: Normal range of motion.  Neurological: He is alert and oriented to person, place, and time.  Skin: Skin is warm and dry.  Psychiatric: He has a normal mood and affect. Judgment normal.  Nursing note and vitals reviewed.    ED Treatments / Results  Labs (all labs ordered are listed, but only abnormal results are displayed) Labs Reviewed  URINALYSIS, ROUTINE W REFLEX  MICROSCOPIC - Abnormal; Notable for the following:       Result Value   Ketones, ur 20 (*)    All other components within normal limits    EKG  EKG Interpretation None       Radiology Ct Renal Stone Study  Result Date: 12/19/2016 CLINICAL DATA:  Right-sided flank pain and nausea for 1 hour. EXAM: CT ABDOMEN AND PELVIS WITHOUT CONTRAST TECHNIQUE: Multidetector CT imaging of the abdomen and pelvis was performed following the standard protocol without IV contrast. COMPARISON:  Body CT 04/10/2009 FINDINGS: Lower chest: No acute abnormality. Hepatobiliary: No focal liver abnormality is seen. No gallstones, gallbladder wall thickening, or biliary dilatation. Pancreas: Unremarkable. No pancreatic ductal dilatation or surrounding inflammatory changes. Spleen: Normal in size without focal abnormality. Adrenals/Urinary Tract: 3 mm distal right ureteral calculus, slightly upstream to the vesicoureteral junction, causing mild upstream hydroureter and minimal right hydronephrosis. Inflammatory changes in the right renal pelvis. Multiple tiny 1-2 mm nonobstructing calculi within the left kidney. Two tiny nonobstructing calculi within the right kidney. Stomach/Bowel: Stomach is within normal limits. Appendix appears normal. No evidence of bowel wall thickening, distention, or inflammatory changes. Vascular/Lymphatic: No significant vascular findings are present. No enlarged abdominal or pelvic lymph nodes. Reproductive: Prostate is unremarkable. Other: No abdominal wall hernia or abnormality. No abdominopelvic ascites. Musculoskeletal: 11 mm sclerotic focus within L5 vertebral body has slightly enlarged when compared to 2010 CT. IMPRESSION: Right obstructive uropathy caused by 3 mm distal right ureteral calculus. Bilateral nephrolithiasis. Slightly enlarged 11 mm sclerotic focus within L5 vertebral body. In the absence of malignancy capable of producing sclerotic lesions, this likely represents a bone island.  Follow-up with lumbosacral spine radiographs may be considered. Electronically Signed   By: Ted Mcalpineobrinka  Dimitrova M.D.   On: 12/19/2016 20:30    Procedures Procedures (including critical care time)  Medications Ordered in ED Medications  ketorolac (TORADOL) 30 MG/ML injection 30 mg (30 mg Intravenous Given 12/19/16 2008)  HYDROmorphone (DILAUDID) injection 1 mg (1 mg Intravenous Given 12/19/16 2008)  ondansetron (ZOFRAN) injection 4 mg (4 mg Intravenous Given 12/19/16 2008)     Initial Impression / Assessment and Plan / ED Course  I have reviewed the triage vital signs and the nursing notes.  Pertinent labs & imaging results that were available during my care of the patient were reviewed by me and considered in my medical decision making (see chart for details).     Feels much better after acute management in the emergency department.  3 mm right ureteral stone.  Outpatient urology follow-up.  Standard stone precautions.  Pain control.  Patient understands return to the ER for new or worsening symptoms  Final  Clinical Impressions(s) / ED Diagnoses   Final diagnoses:  Right ureteral stone    New Prescriptions Discharge Medication List as of 12/19/2016  9:57 PM    START taking these medications   Details  ibuprofen (ADVIL,MOTRIN) 600 MG tablet Take 1 tablet (600 mg total) by mouth every 8 (eight) hours as needed., Starting Sun 12/19/2016, Print    ondansetron (ZOFRAN ODT) 8 MG disintegrating tablet Take 1 tablet (8 mg total) by mouth every 8 (eight) hours as needed for nausea or vomiting., Starting Sun 12/19/2016, Print    oxyCODONE-acetaminophen (PERCOCET/ROXICET) 5-325 MG tablet Take 1 tablet by mouth every 4 (four) hours as needed for severe pain., Starting Sun 12/19/2016, Print    tamsulosin (FLOMAX) 0.4 MG CAPS capsule Take 1 capsule (0.4 mg total) by mouth daily., Starting Sun 12/19/2016, Print         Azalia Bilis, MD 12/19/16 [phone]

## 2016-12-19 NOTE — ED Triage Notes (Signed)
Pt reports R sided flank pain present X1 hr with nausea. Hx of kidney stone. Pt appears to be in distress, very uncomfortable in triage.

## 2016-12-19 NOTE — ED Notes (Signed)
Patient transported to CT 

## 2017-03-21 ENCOUNTER — Other Ambulatory Visit: Payer: Self-pay

## 2017-03-21 MED ORDER — ROSUVASTATIN CALCIUM 20 MG PO TABS: 20.0000 mg | ORAL_TABLET | Freq: Every day | ORAL | 0 refills | Status: DC

## 2017-03-21 NOTE — Telephone Encounter (Signed)
September 29, 2016        4:51 PM  Alois Cliche, LPN routed this conversation to Baird Lyons, RN  [place], Chief of Staff, LPN      2:13 PM  Note    DISCUSSED  CARDIAC CT FINDINGS WITH  DR  Ladona Ridgel  NO F/U NEEDED AT THIS TIME  BUT  NEED TO  SWITCH  LIPITOR  10 MG TO CRESTOR  20 MG  DAILY  PT ON VACATION  WILL NEED TO  CALL  LATER  WITH  RECOMMENDATIONS ./CY          4:48 PM    [name] contacted Alois Cliche, LPN

## 2017-04-26 DIAGNOSIS — E039 Hypothyroidism, unspecified: Secondary | ICD-10-CM | POA: Diagnosis not present

## 2017-04-29 DIAGNOSIS — R5383 Other fatigue: Secondary | ICD-10-CM | POA: Diagnosis not present

## 2017-04-29 DIAGNOSIS — R630 Anorexia: Secondary | ICD-10-CM | POA: Diagnosis not present

## 2017-05-03 DIAGNOSIS — E039 Hypothyroidism, unspecified: Secondary | ICD-10-CM | POA: Diagnosis not present

## 2017-06-15 ENCOUNTER — Other Ambulatory Visit: Payer: Self-pay

## 2017-06-15 MED ORDER — ROSUVASTATIN CALCIUM 20 MG PO TABS: 20.0000 mg | ORAL_TABLET | Freq: Every day | ORAL | 0 refills | Status: DC

## 2017-09-18 ENCOUNTER — Other Ambulatory Visit: Payer: Self-pay | Admitting: Internal Medicine

## 2017-09-22 ENCOUNTER — Other Ambulatory Visit: Payer: Self-pay | Admitting: Internal Medicine

## 2017-09-22 MED ORDER — AMLODIPINE BESYLATE 5 MG PO TABS: 5.0000 mg | ORAL_TABLET | Freq: Every day | ORAL | 0 refills | Status: DC

## 2017-09-22 MED ORDER — ROSUVASTATIN CALCIUM 20 MG PO TABS: 20.0000 mg | ORAL_TABLET | Freq: Every day | ORAL | 0 refills | Status: DC

## 2017-09-22 NOTE — Telephone Encounter (Signed)
Pt's medication was sent to pt's pharmacy as requested. Confirmation received.  °

## 2017-09-22 NOTE — Telephone Encounter (Signed)
°*  STAT* If patient is at the pharmacy, call can be transferred to refill team.   1. Which medications need to be refilled? (please list name of each medication and dose if known) Amlodipine 5 mg, Rosuvastatin 20 mg  2. Which pharmacy/location (including street and city if local pharmacy) is medication to be sent to? [id number] - [place], [place] - [address]  3. Do they need a 30 day or 90 day supply? [id number]  ' Patient scheduled to see Dr. Ladona Ridgelaylor 12-09-17 @8 :15am

## 2017-09-26 DIAGNOSIS — L218 Other seborrheic dermatitis: Secondary | ICD-10-CM | POA: Diagnosis not present

## 2017-09-26 DIAGNOSIS — L308 Other specified dermatitis: Secondary | ICD-10-CM | POA: Diagnosis not present

## 2017-09-26 DIAGNOSIS — L812 Freckles: Secondary | ICD-10-CM | POA: Diagnosis not present

## 2017-09-26 DIAGNOSIS — L821 Other seborrheic keratosis: Secondary | ICD-10-CM | POA: Diagnosis not present

## 2017-10-05 DIAGNOSIS — H16142 Punctate keratitis, left eye: Secondary | ICD-10-CM | POA: Diagnosis not present

## 2017-12-09 ENCOUNTER — Encounter: Payer: Self-pay | Admitting: Internal Medicine

## 2017-12-09 ENCOUNTER — Ambulatory Visit: Payer: BLUE CROSS/BLUE SHIELD | Admitting: Internal Medicine

## 2017-12-09 ENCOUNTER — Ambulatory Visit: Payer: Managed Care, Other (non HMO) | Admitting: Internal Medicine

## 2017-12-09 VITALS — BP 126/56 | HR 51 | Ht 69.0 in | Wt 157.0 lb

## 2017-12-09 DIAGNOSIS — E785 Hyperlipidemia, unspecified: Secondary | ICD-10-CM | POA: Diagnosis not present

## 2017-12-09 DIAGNOSIS — I10 Essential (primary) hypertension: Secondary | ICD-10-CM | POA: Diagnosis not present

## 2017-12-09 DIAGNOSIS — R079 Chest pain, unspecified: Secondary | ICD-10-CM | POA: Diagnosis not present

## 2017-12-09 LAB — LIPID PANEL
CHOLESTEROL TOTAL: 162 mg/dL (ref 100–199)
Chol/HDL Ratio: 3 ratio (ref 0.0–5.0)
HDL: 54 mg/dL (ref >39)
LDL Calculated: 77 mg/dL (ref 0–99)
Triglycerides: 155 mg/dL — ABNORMAL HIGH (ref 0–149)
VLDL Cholesterol Cal: 31 mg/dL (ref 5–40)

## 2017-12-09 LAB — HEPATIC FUNCTION PANEL
ALK PHOS: 65 IU/L (ref 39–117)
ALT: 22 IU/L (ref 0–44)
AST: 25 IU/L (ref 0–40)
Albumin: 4.5 g/dL (ref 3.5–5.5)
BILIRUBIN TOTAL: 0.6 mg/dL (ref 0.0–1.2)
BILIRUBIN, DIRECT: 0.19 mg/dL (ref 0.00–0.40)
Total Protein: 6.7 g/dL (ref 6.0–8.5)

## 2017-12-09 NOTE — Patient Instructions (Signed)

## 2017-12-09 NOTE — Progress Notes (Signed)
HPI Mr. Siebenaler returns today for followup. He is a 57 year old man with a history of hypertension, dyslipidemia, and thyroid dysfunction. He has a family history of heart disease and had a negative stress test several years ago. He has non-cardiac chest pain.  He has not had syncope. No relationship to position. He denies fevers or chills. He has a father with CAD, s/p CABG and a brother with prior MI/s/p stenting. [place]. He has not had fasting lipids or a liver panel recently.   No Known Allergies   Current Outpatient Medications  Medication Sig Dispense Refill  . amLODipine (NORVASC) 5 MG tablet Take 1 tablet (5 mg total) by mouth daily. Please keep upcoming appt before anymore refills. Thank you 90 tablet 0  . Artificial Tear Ointment (DRY EYES OP) Apply 1 drop to eye as needed (dry eye).    Marland Kitchen ibuprofen (ADVIL,MOTRIN) 600 MG tablet Take 1 tablet (600 mg total) by mouth every 8 (eight) hours as needed. 15 tablet 0  . levothyroxine (SYNTHROID, LEVOTHROID) 100 MCG tablet Take 100 mcg by mouth. Takes 100 mg 2 tablets daily a week.    Marland Kitchen liothyronine (CYTOMEL) 5 MCG tablet Take 10 mcg by mouth daily.     . rosuvastatin (CRESTOR) 20 MG tablet Take 1 tablet (20 mg total) by mouth daily. Please keep upcoming appt before anymore refills. Thank you 90 tablet 0   No current facility-administered medications for this visit.      Past Medical History:  Diagnosis Date  . Chest pain 11/24/2012  . Dyslipidemia 11/24/2012  . Essential hypertension 11/24/2012  . Hypertension   . Leg pain 11/24/2012  . Thyroid disease    hypo    ROS:   All systems reviewed and negative except as noted in the HPI.   Past Surgical History:  Procedure Laterality Date  . KNEE SURGERY       Family History  Problem Relation Age of Onset  . Heart disease Mother   . Heart disease Father   . Heart attack Father      Social History   Socioeconomic History  . Marital  status: Single    Spouse name: Not on file  . Number of children: Not on file  . Years of education: Not on file  . Highest education level: Not on file  Occupational History  . Not on file  Social Needs  . Financial resource strain: Not on file  . Food insecurity:    Worry: Not on file    Inability: Not on file  . Transportation needs:    Medical: Not on file    Non-medical: Not on file  Tobacco Use  . Smoking status: Former Smoker    Packs/day: 0.50    Years: 2.00    Pack years: 1.00    Types: Cigarettes    Last attempt to quit: 06/21/1980    Years since quitting: 37.4  . Smokeless tobacco: Former Neurosurgeon    Types: Snuff    Quit date: 06/21/1980  Substance and Sexual Activity  . Alcohol use: No    Comment: occassional  . Drug use: No  . Sexual activity: Not on file  Lifestyle  . Physical activity:    Days per week: Not on file    Minutes per session: Not on file  . Stress: Not on file  Relationships  . Social connections:    Talks on phone: Not on file  Gets together: Not on file    Attends religious service: Not on file    Active member of club or organization: Not on file    Attends meetings of clubs or organizations: Not on file    Relationship status: Not on file  . Intimate partner violence:    Fear of current or ex partner: Not on file    Emotionally abused: Not on file    Physically abused: Not on file    Forced sexual activity: Not on file  Other Topics Concern  . Not on file  Social History Narrative  . Not on file     BP (!) 126/56   Pulse (!) 51   Ht 5\' 9"  (1.753 m)   Wt 157 lb (71.2 kg)   SpO2 98%   BMI 23.18 kg/m   Physical Exam:  Well appearing NAD HEENT: Unremarkable Neck:  No JVD, no thyromegally Lymphatics:  No adenopathy Back:  No CVA tenderness Lungs:  Clear with no wheezes HEART:  Regular rate rhythm, no murmurs, no rubs, no clicks Abd:  soft, positive bowel sounds, no organomegally, no rebound, no guarding Ext:  2 plus  pulses, no edema, no cyanosis, no clubbing Skin:  No rashes no nodules Neuro:  CN II through XII intact, motor grossly intact  EKG - nsr with nsstt changes   Assess/Plan: 1. Atypical chest pain - non-exertional and quiet at this point.  2. Dyslipidemia - he has not had fasting lipids. He will continue crestor.  3. HTN - he is doing well on amlodipine.   Leonia ReevesGregg Taylor,M.D.

## 2017-12-10 ENCOUNTER — Encounter: Payer: Self-pay | Admitting: Internal Medicine

## 2017-12-12 ENCOUNTER — Other Ambulatory Visit: Payer: Self-pay | Admitting: Internal Medicine

## 2018-01-09 ENCOUNTER — Ambulatory Visit: Payer: Self-pay | Admitting: Internal Medicine

## 2018-03-07 DIAGNOSIS — H10233 Serous conjunctivitis, except viral, bilateral: Secondary | ICD-10-CM | POA: Diagnosis not present

## 2018-04-26 DIAGNOSIS — E039 Hypothyroidism, unspecified: Secondary | ICD-10-CM | POA: Diagnosis not present

## 2018-05-03 DIAGNOSIS — E039 Hypothyroidism, unspecified: Secondary | ICD-10-CM | POA: Diagnosis not present

## 2018-10-02 DIAGNOSIS — L570 Actinic keratosis: Secondary | ICD-10-CM | POA: Diagnosis not present

## 2018-10-02 DIAGNOSIS — L821 Other seborrheic keratosis: Secondary | ICD-10-CM | POA: Diagnosis not present

## 2018-10-02 DIAGNOSIS — L812 Freckles: Secondary | ICD-10-CM | POA: Diagnosis not present

## 2018-10-21 DIAGNOSIS — H1089 Other conjunctivitis: Secondary | ICD-10-CM | POA: Diagnosis not present

## 2018-11-10 ENCOUNTER — Telehealth: Payer: Self-pay | Admitting: Internal Medicine

## 2018-11-10 NOTE — Telephone Encounter (Signed)
New message    Spoke w/pt about appt on 06.23.20 with Dr. Ladona Ridgelaylor. Pt was rescheduled to 05.26.20. Pt smart phone number is listed in appt notes.      Virtual Visit Pre-Appointment Phone Call  "(Name), I am calling you today to discuss your upcoming appointment. We are currently trying to limit exposure to the virus that causes COVID-19 by seeing patients at home rather than in the office."  1. "What is the BEST phone number to call the day of the visit?" - include this in appointment notes  2. Do you have or have access to (through a family member/friend) a smartphone with video capability that we can use for your visit?" a. If yes - list this number in appt notes as cell (if different from BEST phone #) and list the appointment type as a VIDEO visit in appointment notes b. If no - list the appointment type as a PHONE visit in appointment notes  3. Confirm consent - "In the setting of the current Covid19 crisis, you are scheduled for a (phone or video) visit with your provider on (date) at (time).  Just as we do with many in-office visits, in order for you to participate in this visit, we must obtain consent.  If you'd like, I can send this to your mychart (if signed up) or email for you to review.  Otherwise, I can obtain your verbal consent now.  All virtual visits are billed to your insurance company just like a normal visit would be.  By agreeing to a virtual visit, we'd like you to understand that the technology does not allow for your provider to perform an examination, and thus may limit your provider's ability to fully assess your condition. If your provider identifies any concerns that need to be evaluated in person, we will make arrangements to do so.  Finally, though the technology is pretty good, we cannot assure that it will always work on either your or our end, and in the setting of a video visit, we may have to convert it to a phone-only visit.  In either situation, we cannot  ensure that we have a secure connection.  Are you willing to proceed?" STAFF: Did the patient verbally acknowledge consent to telehealth visit? Document YES/NO here: YES  4. Advise patient to be prepared - "Two hours prior to your appointment, go ahead and check your blood pressure, pulse, oxygen saturation, and your weight (if you have the equipment to check those) and write them all down. When your visit starts, your provider will ask you for this information. If you have an Apple Watch or Kardia device, please plan to have heart rate information ready on the day of your appointment. Please have a pen and paper handy nearby the day of the visit as well."  5. Give patient instructions for MyChart download to smartphone OR [email or web address] as below if video visit (depending on what platform provider is using)  6. Inform patient they will receive a phone call 15 minutes prior to their appointment time (may be from unknown caller ID) so they should be prepared to answer    TELEPHONE CALL NOTE  William Mcmillan has been deemed a candidate for a follow-up tele-health visit to limit community exposure during the Covid-19 pandemic. I spoke with the patient via phone to ensure availability of phone/video source, confirm preferred email & phone number, and discuss instructions and expectations.  I reminded William Mcmillan to be prepared with any vital sign and/or  heart rhythm information that could potentially be obtained via home monitoring, at the time of his visit. I reminded William Mcmillan to expect a phone call prior to his visit.  Elyse Hsu 11/10/2018 8:36 AM   INSTRUCTIONS FOR DOWNLOADING THE MYCHART APP TO SMARTPHONE  - The patient must first make sure to have activated MyChart and know their login information - If Apple, go to [employer] and type in MyChart in the search bar and download the app. If Android, ask patient to go to [employer] and type in R[place] in the search bar and  download the app. The app is free but as with any other app downloads, their phone may require them to verify saved payment information or Apple/Android password.  - The patient will need to then log into the app with their MyChart username and password, and select [facility] as their healthcare provider to link the account. When it is time for your visit, go to the MyChart app, find appointments, and click Begin Video Visit. Be sure to Select Allow for your device to access the Microphone and Camera for your visit. You will then be connected, and your provider will be with you shortly.  **If they have any issues connecting, or need assistance please contact MyChart service desk [phone])**  **If using a computer, in order to ensure the best quality for their visit they will need to use either of the following Internet Browsers: [employer] or Google Chrome**  IF USING DOXIMITY or DOXY.ME - The patient will receive a link just prior to their visit by text.     FULL LENGTH CONSENT FOR TELE-HEALTH VISIT   I hereby voluntarily request, consent and authorize [facility] and its employed or contracted physicians, physician assistants, nurse practitioners or other licensed health care professionals (the Practitioner), to provide me with telemedicine health care services (the Services") as deemed necessary by the treating Practitioner. I acknowledge and consent to receive the Services by the Practitioner via telemedicine. I understand that the telemedicine visit will involve communicating with the Practitioner through live audiovisual communication technology and the disclosure of certain medical information by electronic transmission. I acknowledge that I have been given the opportunity to request an in-person assessment or other available alternative prior to the telemedicine visit and am voluntarily participating in the telemedicine visit.  I understand that I have the right to  withhold or withdraw my consent to the use of telemedicine in the course of my care at any time, without affecting my right to future care or treatment, and that the Practitioner or I may terminate the telemedicine visit at any time. I understand that I have the right to inspect all information obtained and/or recorded in the course of the telemedicine visit and may receive copies of available information for a reasonable fee.  I understand that some of the potential risks of receiving the Services via telemedicine include:   Delay or interruption in medical evaluation due to technological equipment failure or disruption;  Information transmitted may not be sufficient (e.g. poor resolution of images) to allow for appropriate medical decision making by the Practitioner; and/or   In rare instances, security protocols could fail, causing a breach of personal health information.  Furthermore, I acknowledge that it is my responsibility to provide information about my medical history, conditions and care that is complete and accurate to the best of my ability. I acknowledge that Practitioner's advice, recommendations, and/or decision may be based on  factors not within their control, such as incomplete or inaccurate data provided by me or distortions of diagnostic images or specimens that may result from electronic transmissions. I understand that the practice of medicine is not an exact science and that Practitioner makes no warranties or guarantees regarding treatment outcomes. I acknowledge that I will receive a copy of this consent concurrently upon execution via email to the email address I last provided but may also request a printed copy by calling the office of [facility].    I understand that my insurance will be billed for this visit.   I have read or had this consent read to me.  I understand the contents of this consent, which adequately explains the benefits and risks of the Services being  provided via telemedicine.   I have been provided ample opportunity to ask questions regarding this consent and the Services and have had my questions answered to my satisfaction.  I give my informed consent for the services to be provided through the use of telemedicine in my medical care  By participating in this telemedicine visit I agree to the above.

## 2018-11-11 ENCOUNTER — Encounter (HOSPITAL_COMMUNITY): Payer: Self-pay

## 2018-11-11 ENCOUNTER — Ambulatory Visit (HOSPITAL_COMMUNITY)
Admission: EM | Admit: 2018-11-11 | Discharge: 2018-11-11 | Disposition: A | Discharge status: Home / Self Care | Payer: BLUE CROSS/BLUE SHIELD | Attending: Family Medicine | Admitting: Family Medicine

## 2018-11-11 ENCOUNTER — Other Ambulatory Visit: Payer: Self-pay

## 2018-11-11 DIAGNOSIS — H109 Unspecified conjunctivitis: Secondary | ICD-10-CM

## 2018-11-11 MED ORDER — OFLOXACIN 0.3 % OP SOLN: 1.0000 [drp] | Freq: Four times a day (QID) | OPHTHALMIC | 0 refills | Status: DC

## 2018-11-11 NOTE — Discharge Instructions (Addendum)
Use eyedrops daily for the next week. Important to avoid contact use for the next week as well. Return if symptoms do not improve/worsen.

## 2018-11-11 NOTE — ED Provider Notes (Signed)
[facility]    CSN: [id number] Arrival date & time: 11/11/18  1048     History   Chief Complaint Chief Complaint  Patient presents with  . Eye Problem    HPI William Mcmillan is a 58 y.o. male presenting for acute concern of left eye swelling and pain that is been worsening for the last 3 days.  Patient states pain is constant: Denies pain with extraocular movements, headache, ear pain, jaw pain.  Patient states that he was treated for bacterial conjunctivitis last month so he began using his TobraDex eyedrops without relief.  She denies fever, chills, malaise.  Of note, patient does wear contacts, is wearing glasses today.    Past Medical History:  Diagnosis Date  . Chest pain 11/24/2012  . Dyslipidemia 11/24/2012  . Essential hypertension 11/24/2012  . Hypertension   . Leg pain 11/24/2012  . Thyroid disease    hypo    Patient Active Problem List   Diagnosis Date Noted  . Chest pain 11/24/2012  . Leg pain 11/24/2012  . Essential hypertension 11/24/2012  . Dyslipidemia 11/24/2012    Past Surgical History:  Procedure Laterality Date  . KNEE SURGERY         Home Medications    Prior to Admission medications   Medication Sig Start Date End Date Taking? Authorizing Provider  amLODipine (NORVASC) 5 MG tablet TAKE 1 TABLET BY MOUTH DAILY. PLEASE KEEP UPCOMING APPT BEFORE ANYMORE REFILLS 12/13/17   Marinus Mawaylor, Gregg W, MD  Artificial Tear Ointment (DRY EYES OP) Apply 1 drop to eye as needed (dry eye).    [provider]  ibuprofen (ADVIL,MOTRIN) 600 MG tablet Take 1 tablet (600 mg total) by mouth every 8 (eight) hours as needed. 12/19/16   Azalia Bilisampos, Kevin, MD  levothyroxine (SYNTHROID, LEVOTHROID) 100 MCG tablet Take 100 mcg by mouth. Takes 100 mg 2 tablets daily a week.    [provider]  liothyronine (CYTOMEL) 5 MCG tablet Take 10 mcg by mouth daily.     [provider]  ofloxacin (OCUFLOX) 0.3 % ophthalmic solution Place 1 drop into the left eye  4 (four) times daily. 11/11/18   Hall-Potvin, [place], PA-C  rosuvastatin (CRESTOR) 20 MG tablet TAKE 1 TABLET BY MOUTH DAILY. PLEASE KEEP UPCOMING APPT BEFORE ANYMORE REFILLS. THANK YOU 12/13/17   Marinus Mawaylor, Gregg W, MD    Family History Family History  Problem Relation Age of Onset  . Heart disease Mother   . Heart disease Father   . Heart attack Father     Social History Social History   Tobacco Use  . Smoking status: Former Smoker    Packs/day: 0.50    Years: 2.00    Pack years: 1.00    Types: Cigarettes    Last attempt to quit: 06/21/1980    Years since quitting: 38.4  . Smokeless tobacco: Former NeurosurgeonUser    Types: Snuff    Quit date: 06/21/1980  Substance Use Topics  . Alcohol use: No    Comment: occassional  . Drug use: No     Allergies   Patient has no known allergies.   Review of Systems Review of Systems  Constitutional: Negative for fatigue and fever.  HENT: Negative for ear pain, sinus pressure and sinus pain.   Eyes: Negative for photophobia, pain, discharge, redness, itching and visual disturbance.       Positive for infraorbital tenderness and swelling  Respiratory: Negative for cough and shortness of breath.   Skin: Negative for  rash and wound.     Physical Exam Triage Vital Signs ED Triage Vitals  Enc Vitals Group     BP 11/11/18 1105 125/82     Pulse Rate 11/11/18 1105 68     Resp 11/11/18 1105 18     Temp 11/11/18 1105 98.1 F (36.7 C)     Temp Source 11/11/18 1105 Oral     SpO2 11/11/18 1105 99 %     Weight 11/11/18 1105 155 lb (70.3 kg)     Height --      Head Circumference --      Peak Flow --      Pain Score 11/11/18 1104 8     Pain Loc --      Pain Edu? --      Excl. in GC? --    No data found.  Updated Vital Signs BP 125/82 (BP Location: Right Arm)   Pulse 68   Temp 98.1 F (36.7 C) (Oral)   Resp 18   Wt 155 lb (70.3 kg)   SpO2 99%   BMI 22.89 kg/m   Visual Acuity Right Eye Distance:   Left Eye Distance:   Bilateral  Distance:    Right Eye Near:   Left Eye Near:    Bilateral Near:     Physical Exam Constitutional:      General: He is not in acute distress.    Appearance: Normal appearance. He is normal weight.  HENT:     Head: Normocephalic and atraumatic.     Right Ear: Tympanic membrane, ear canal and external ear normal.     Left Ear: Tympanic membrane, ear canal and external ear normal.     Nose: Nose normal. No congestion.     Mouth/Throat:     Mouth: Mucous membranes are moist.     Pharynx: Oropharynx is clear. No oropharyngeal exudate or posterior oropharyngeal erythema.  Eyes:     General: Lids are everted, no foreign bodies appreciated. Gaze aligned appropriately. No allergic shiner, visual field deficit or scleral icterus.       Right eye: No foreign body or discharge.        Left eye: No foreign body or discharge.     Extraocular Movements: Extraocular movements intact.     Conjunctiva/sclera:     Right eye: Right conjunctiva is not injected. No exudate.    Left eye: Left conjunctiva is injected. Exudate present.     Comments: Left lower lid with injection, focal edema, and exudate with lid retraction.    Neck:     Musculoskeletal: Neck supple. No muscular tenderness.  Cardiovascular:     Rate and Rhythm: Normal rate and regular rhythm.  Pulmonary:     Effort: Pulmonary effort is normal.     Breath sounds: Normal breath sounds.  Lymphadenopathy:     Cervical: No cervical adenopathy.  Neurological:     Mental Status: He is alert.      UC Treatments / Results  Labs (all labs ordered are listed, but only abnormal results are displayed) Labs Reviewed - No data to display  EKG None  Radiology No results found.  Procedures Procedures (including critical care time)  Medications Ordered in UC Medications - No data to display  Initial Impression / Assessment and Plan / UC Course  I have reviewed the triage vital signs and the nursing notes.  Pertinent labs & imaging  results that were available during my care of the patient were reviewed by me  and considered in my medical decision making (see chart for details).     58 year old male with right eye bacterial conjunctivitis and exudate.  Overall stable, will change antibiotic.  Discussed return precautions, patient verbalized understanding.  Patient is to avoid contact use for the next week. Final Clinical Impressions(s) / UC Diagnoses   Final diagnoses:  Bacterial conjunctivitis of left eye     Discharge Instructions     Use eyedrops daily for the next week. Important to avoid contact use for the next week as well. Return if symptoms do not improve/worsen.    ED Prescriptions    Medication Sig Dispense Auth. Provider   ofloxacin (OCUFLOX) 0.3 % ophthalmic solution Place 1 drop into the left eye 4 (four) times daily. 5 mL Hall-Potvin, [place], PA-C     Controlled Substance Prescriptions [place] Controlled Substance Registry consulted? Not Applicable   Shea Evans, [place] 11/11/18 1453

## 2018-11-11 NOTE — ED Triage Notes (Signed)
Pt cc left eyelid issue the lower lid swelling.  Pt states this has been going on for 4 days, It started on the top and now its on the bottom.

## 2018-11-14 ENCOUNTER — Telehealth (INDEPENDENT_AMBULATORY_CARE_PROVIDER_SITE_OTHER): Payer: BLUE CROSS/BLUE SHIELD | Admitting: Internal Medicine

## 2018-11-14 ENCOUNTER — Other Ambulatory Visit: Payer: Self-pay

## 2018-11-14 DIAGNOSIS — E785 Hyperlipidemia, unspecified: Secondary | ICD-10-CM | POA: Diagnosis not present

## 2018-11-14 DIAGNOSIS — I10 Essential (primary) hypertension: Secondary | ICD-10-CM

## 2018-11-14 NOTE — Progress Notes (Signed)
Electrophysiology TeleHealth Note   Due to national recommendations of social distancing due to COVID 19, an audio/video telehealth visit is felt to be most appropriate for this patient at this time.  See MyChart message from today for the patient's consent to telehealth for [facility].   Date:  11/14/2018   ID:  [name], DOB [birth year], MRN [id number]  Location: patient's home  Provider location: [address] [place]  Evaluation Performed: Follow-up visit  PCP:  Kirby Funk, MD  Cardiologist:  No primary care provider on file. Ladona Ridgel Electrophysiologist:  Dr Ladona Ridgel  Chief Complaint:  "I'm doing alright."  History of Present Illness:    William Mcmillan is a 58 y.o. male who presents via audio/video conferencing for a telehealth visit today.  He has a h/o HTN, dyslipidemia, and thyroid dysfunction. He remains active, running 5 miles 4 times a week. Since last being seen in our clinic, the patient reports doing very well. He has been treated for a sty in his eye. Today, he denies symptoms of palpitations, chest pain, shortness of breath,  lower extremity edema, dizziness, presyncope, or syncope.  The patient is otherwise without complaint today.  The patient denies symptoms of fevers, chills, cough, or new SOB worrisome for COVID 19.  Past Medical History:  Diagnosis Date  . Chest pain 11/24/2012  . Dyslipidemia 11/24/2012  . Essential hypertension 11/24/2012  . Hypertension   . Leg pain 11/24/2012  . Thyroid disease    hypo    Past Surgical History:  Procedure Laterality Date  . KNEE SURGERY      Current Outpatient Medications  Medication Sig Dispense Refill  . amLODipine (NORVASC) 5 MG tablet TAKE 1 TABLET BY MOUTH DAILY. PLEASE KEEP UPCOMING APPT BEFORE ANYMORE REFILLS 90 tablet 3  . Artificial Tear Ointment (DRY EYES OP) Apply 1 drop to eye as needed (dry eye).    Marland Kitchen ibuprofen (ADVIL,MOTRIN) 600 MG tablet Take 1 tablet (600 mg total) by mouth every 8  (eight) hours as needed. 15 tablet 0  . levothyroxine (SYNTHROID, LEVOTHROID) 100 MCG tablet Take 100 mcg by mouth. Takes 100 mg 2 tablets daily a week.    Marland Kitchen liothyronine (CYTOMEL) 5 MCG tablet Take 10 mcg by mouth daily.     Marland Kitchen ofloxacin (OCUFLOX) 0.3 % ophthalmic solution Place 1 drop into the left eye 4 (four) times daily. 5 mL 0  . rosuvastatin (CRESTOR) 20 MG tablet TAKE 1 TABLET BY MOUTH DAILY. PLEASE KEEP UPCOMING APPT BEFORE ANYMORE REFILLS. THANK YOU 90 tablet 3   No current facility-administered medications for this visit.     Allergies:   Patient has no known allergies.   Social History:  The patient  reports that he quit smoking about 38 years ago. His smoking use included cigarettes. He has a 1.00 pack-year smoking history. He quit smokeless tobacco use about 38 years ago.  His smokeless tobacco use included snuff. He reports that he does not drink alcohol or use drugs.   Family History:  The patient's  family history includes Heart attack in his father; Heart disease in his father and mother.   ROS:  Please see the history of present illness.   All other systems are personally reviewed and negative.    Exam:    Vital Signs:    Well appearing, alert and conversant, regular work of breathing,  good skin color Eyes- anicteric, neuro- grossly intact, skin- no apparent rash or lesions or cyanosis, mouth- oral  mucosa is pink   Labs/Other Tests and Data Reviewed:    Recent Labs: 12/09/2017: ALT 22   Wt Readings from Last 3 Encounters:  11/11/18 155 lb (70.3 kg)  12/09/17 157 lb (71.2 kg)  12/19/16 155 lb (70.3 kg)     Other studies personally reviewed:   ASSESSMENT & PLAN:    1.  HTN - his bp at home has been alright. He will continue amlodipine. He is not overweight. He will continue his exercise program. 2. Dyslipidemia - he will continue crestor and we will await fasting lipids.  3. Chest pain - he has had none since his last visit. He will undergo watchful  waiting. 4. COVID 19 screen The patient denies symptoms of COVID 19 at this time.  The importance of social distancing was discussed today.  Follow-up:  12 months Next remote: n/a  Current medicines are reviewed at length with the patient today.   The patient does not have concerns regarding his medicines.  The following changes were made today:  none  Labs/ tests ordered today include: none No orders of the defined types were placed in this encounter.    Patient Risk:  after full review of this patients clinical status, I feel that they are at moderate risk at this time.  Today, I have spent 15 minutes with the patient with telehealth technology discussing all of the above.    Signed, Lewayne BuntingGregg Elif Yonts, MD  11/14/2018 2:45 PM     [facility] [address] [place] [address] [phone] (office) [phone] (fax)

## 2018-12-12 ENCOUNTER — Ambulatory Visit: Payer: BLUE CROSS/BLUE SHIELD | Admitting: Internal Medicine

## 2018-12-19 ENCOUNTER — Other Ambulatory Visit: Payer: Self-pay | Admitting: Internal Medicine

## 2018-12-29 DIAGNOSIS — Z125 Encounter for screening for malignant neoplasm of prostate: Secondary | ICD-10-CM | POA: Diagnosis not present

## 2018-12-29 DIAGNOSIS — I10 Essential (primary) hypertension: Secondary | ICD-10-CM | POA: Diagnosis not present

## 2018-12-29 DIAGNOSIS — R51 Headache: Secondary | ICD-10-CM | POA: Diagnosis not present

## 2018-12-29 DIAGNOSIS — Z23 Encounter for immunization: Secondary | ICD-10-CM | POA: Diagnosis not present

## 2018-12-29 DIAGNOSIS — Z0000 Encounter for general adult medical examination without abnormal findings: Secondary | ICD-10-CM | POA: Diagnosis not present

## 2018-12-29 DIAGNOSIS — E039 Hypothyroidism, unspecified: Secondary | ICD-10-CM | POA: Diagnosis not present

## 2018-12-29 DIAGNOSIS — Z8249 Family history of ischemic heart disease and other diseases of the circulatory system: Secondary | ICD-10-CM | POA: Diagnosis not present

## 2018-12-29 DIAGNOSIS — E7800 Pure hypercholesterolemia, unspecified: Secondary | ICD-10-CM | POA: Diagnosis not present

## 2019-02-07 DIAGNOSIS — K1321 Leukoplakia of oral mucosa, including tongue: Secondary | ICD-10-CM | POA: Diagnosis not present

## 2019-02-23 DIAGNOSIS — Z23 Encounter for immunization: Secondary | ICD-10-CM | POA: Diagnosis not present

## 2019-05-30 DIAGNOSIS — Z20828 Contact with and (suspected) exposure to other viral communicable diseases: Secondary | ICD-10-CM | POA: Diagnosis not present

## 2019-05-30 DIAGNOSIS — B349 Viral infection, unspecified: Secondary | ICD-10-CM | POA: Diagnosis not present

## 2019-05-31 DIAGNOSIS — Z20828 Contact with and (suspected) exposure to other viral communicable diseases: Secondary | ICD-10-CM | POA: Diagnosis not present

## 2019-07-10 DIAGNOSIS — K140 Glossitis: Secondary | ICD-10-CM | POA: Diagnosis not present

## 2019-07-19 DIAGNOSIS — K140 Glossitis: Secondary | ICD-10-CM | POA: Diagnosis not present

## 2019-09-19 DIAGNOSIS — H10233 Serous conjunctivitis, except viral, bilateral: Secondary | ICD-10-CM | POA: Diagnosis not present

## 2019-12-04 DIAGNOSIS — D225 Melanocytic nevi of trunk: Secondary | ICD-10-CM | POA: Diagnosis not present

## 2019-12-04 DIAGNOSIS — D2271 Melanocytic nevi of right lower limb, including hip: Secondary | ICD-10-CM | POA: Diagnosis not present

## 2019-12-04 DIAGNOSIS — D2272 Melanocytic nevi of left lower limb, including hip: Secondary | ICD-10-CM | POA: Diagnosis not present

## 2019-12-06 ENCOUNTER — Other Ambulatory Visit: Payer: Self-pay | Admitting: Internal Medicine

## 2019-12-29 ENCOUNTER — Other Ambulatory Visit: Payer: Self-pay | Admitting: Internal Medicine

## 2020-01-09 ENCOUNTER — Other Ambulatory Visit: Payer: Self-pay | Admitting: Internal Medicine

## 2020-01-16 DIAGNOSIS — R519 Headache, unspecified: Secondary | ICD-10-CM | POA: Diagnosis not present

## 2020-01-16 DIAGNOSIS — E039 Hypothyroidism, unspecified: Secondary | ICD-10-CM | POA: Diagnosis not present

## 2020-01-16 DIAGNOSIS — H10023 Other mucopurulent conjunctivitis, bilateral: Secondary | ICD-10-CM | POA: Diagnosis not present

## 2020-01-16 DIAGNOSIS — Z23 Encounter for immunization: Secondary | ICD-10-CM | POA: Diagnosis not present

## 2020-01-16 DIAGNOSIS — I10 Essential (primary) hypertension: Secondary | ICD-10-CM | POA: Diagnosis not present

## 2020-01-16 DIAGNOSIS — Z5181 Encounter for therapeutic drug level monitoring: Secondary | ICD-10-CM | POA: Diagnosis not present

## 2020-01-16 DIAGNOSIS — Z0000 Encounter for general adult medical examination without abnormal findings: Secondary | ICD-10-CM | POA: Diagnosis not present

## 2020-01-16 DIAGNOSIS — Z1159 Encounter for screening for other viral diseases: Secondary | ICD-10-CM | POA: Diagnosis not present

## 2020-01-16 DIAGNOSIS — E7800 Pure hypercholesterolemia, unspecified: Secondary | ICD-10-CM | POA: Diagnosis not present

## 2020-01-29 ENCOUNTER — Other Ambulatory Visit: Payer: Self-pay | Admitting: Internal Medicine

## 2020-02-05 ENCOUNTER — Ambulatory Visit (HOSPITAL_COMMUNITY)
Admission: EM | Admit: 2020-02-05 | Discharge: 2020-02-05 | Disposition: A | Discharge status: Home / Self Care | Payer: BC Managed Care – PPO | Attending: Physician Assistant | Admitting: Physician Assistant

## 2020-02-05 DIAGNOSIS — Z1152 Encounter for screening for COVID-19: Secondary | ICD-10-CM

## 2020-02-05 DIAGNOSIS — Z20822 Contact with and (suspected) exposure to covid-19: Secondary | ICD-10-CM | POA: Diagnosis not present

## 2020-02-05 NOTE — ED Triage Notes (Signed)
Pt presents for covid testing after exposure on Friday. Denies any symptoms.

## 2020-02-06 LAB — SARS CORONAVIRUS 2 (TAT 6-24 HRS): SARS Coronavirus 2: NEGATIVE

## 2020-02-20 ENCOUNTER — Telehealth: Payer: Self-pay | Admitting: Internal Medicine

## 2020-02-20 MED ORDER — ROSUVASTATIN CALCIUM 20 MG PO TABS: 20.0000 mg | ORAL_TABLET | Freq: Every day | ORAL | 0 refills | Status: DC

## 2020-02-20 MED ORDER — AMLODIPINE BESYLATE 5 MG PO TABS: 5.0000 mg | ORAL_TABLET | Freq: Every day | ORAL | 0 refills | Status: DC

## 2020-02-20 NOTE — Telephone Encounter (Signed)
*  STAT* If patient is at the pharmacy, call can be transferred to refill team.   1. Which medications need to be refilled? (please list name of each medication and dose if known)  Amlodipine and Rosuvastatin  2. Which pharmacy/location (including street and city if local pharmacy) is medication to be sent to?  [facility] [place], [place]  3. Do they need a 30 day or 90 day supply? 90 days and refills

## 2020-02-20 NOTE — Telephone Encounter (Signed)
Pt's medications were sent to pt's pharmacy as requested. Confirmation received.  

## 2020-04-04 ENCOUNTER — Ambulatory Visit: Payer: BC Managed Care – PPO | Admitting: Student

## 2020-04-14 NOTE — Progress Notes (Signed)
PCP:  Kirby Funk, MD Primary Cardiologist: No primary care provider on file. Electrophysiologist: Lewayne Bunting, MD   William Mcmillan is a 59 y.o. male seen today for Lewayne Bunting, MD for routine electrophysiology followup.  Since last being seen in our clinic the patient reports doing very well. [place], and just finished a two week trip without difficulty.  he denies chest pain, palpitations, dyspnea, PND, orthopnea, nausea, vomiting, dizziness, syncope, edema, weight gain, or early satiety.  Past Medical History:  Diagnosis Date  . Chest pain 11/24/2012  . Dyslipidemia 11/24/2012  . Essential hypertension 11/24/2012  . Hypertension   . Leg pain 11/24/2012  . Thyroid disease    hypo   Past Surgical History:  Procedure Laterality Date  . KNEE SURGERY      Current Outpatient Medications  Medication Sig Dispense Refill  . amLODipine (NORVASC) 5 MG tablet Take 1 tablet (5 mg total) by mouth daily. Please keep upcoming appt in October before anymore refills. Thank you 90 tablet 0  . Artificial Tear Ointment (DRY EYES OP) Apply 1 drop to eye as needed (dry eye).    Marland Kitchen levothyroxine (SYNTHROID, LEVOTHROID) 100 MCG tablet Take 100 mcg by mouth daily. Takes 100 mg 2 tablets daily    . liothyronine (CYTOMEL) 5 MCG tablet Take 10 mcg by mouth daily.     . rosuvastatin (CRESTOR) 20 MG tablet Take 1 tablet (20 mg total) by mouth daily. Please keep upcoming appt in October before anymore refills. Thank you 90 tablet 0   No current facility-administered medications for this visit.    No Known Allergies  Social History   Socioeconomic History  . Marital status: Single    Spouse name: Not on file  . Number of children: Not on file  . Years of education: Not on file  . Highest education level: Not on file  Occupational History  . Not on file  Tobacco Use  . Smoking status: Former Smoker    Packs/day: 0.50    Years: 2.00    Pack years: 1.00    Types:  Cigarettes    Quit date: 06/21/1980    Years since quitting: 39.8  . Smokeless tobacco: Former Neurosurgeon    Types: Snuff    Quit date: 06/21/1980  Vaping Use  . Vaping Use: Never used  Substance and Sexual Activity  . Alcohol use: No    Comment: occassional  . Drug use: No  . Sexual activity: Not on file  Other Topics Concern  . Not on file  Social History Narrative  . Not on file   Social Determinants of Health   Financial Resource Strain:   . Difficulty of Paying Living Expenses: Not on file  Food Insecurity:   . Worried About Programme researcher, broadcasting/film/video in the Last Year: Not on file  . Ran Out of Food in the Last Year: Not on file  Transportation Needs:   . Lack of Transportation (Medical): Not on file  . Lack of Transportation (Non-Medical): Not on file  Physical Activity:   . Days of Exercise per Week: Not on file  . Minutes of Exercise per Session: Not on file  Stress:   . Feeling of Stress : Not on file  Social Connections:   . Frequency of Communication with Friends and Family: Not on file  . Frequency of Social Gatherings with Friends and Family: Not on file  . Attends Religious Services: Not on file  .  Active Member of Clubs or Organizations: Not on file  . Attends Banker Meetings: Not on file  . Marital Status: Not on file  Intimate Partner Violence:   . Fear of Current or Ex-Partner: Not on file  . Emotionally Abused: Not on file  . Physically Abused: Not on file  . Sexually Abused: Not on file     Review of Systems: General: No chills, fever, night sweats or weight changes  Cardiovascular:  No chest pain, dyspnea on exertion, edema, orthopnea, palpitations, paroxysmal nocturnal dyspnea Dermatological: No rash, lesions or masses Respiratory: No cough, dyspnea Urologic: No hematuria, dysuria Abdominal: No nausea, vomiting, diarrhea, bright red blood per rectum, melena, or hematemesis Neurologic: No visual changes, weakness, changes in mental status All  other systems reviewed and are otherwise negative except as noted above.  Physical Exam: Vitals:   04/15/20 1245  BP: 130/68  Pulse: 61  SpO2: 99%  Weight: 147 lb (66.7 kg)  Height: 5\' 9"  (1.753 m)    GEN- The patient is well appearing, alert and oriented x 3 today.   HEENT: normocephalic, atraumatic; sclera clear, conjunctiva pink; hearing intact; oropharynx clear; neck supple, no JVP Lymph- no cervical lymphadenopathy Lungs- Clear to ausculation bilaterally, normal work of breathing.  No wheezes, rales, rhonchi Heart- Regular rate and rhythm, no murmurs, rubs or gallops, PMI not laterally displaced GI- soft, non-tender, non-distended, bowel sounds present, no hepatosplenomegaly Extremities- no clubbing, cyanosis, or edema; DP/PT/radial pulses 2+ bilaterally MS- no significant deformity or atrophy Skin- warm and dry, no rash or lesion Psych- euthymic mood, full affect Neuro- strength and sensation are intact  EKG is ordered. Personal review of EKG from today shows NSR at 61 bpm, QRS 94 ms, PR interval 148 ms  Additional studies reviewed include: Previous EP office notes  Assessment and Plan:  1. HTN Continue current regimen  2. HLD Continue crestor Labs today.   3. Chest pain Distant, and may have been related to swallowing a pill.  He denies any exertional chest pain or SOB with his recent 2 [place]   Labs today and continue annual follow up, sooner with symptoms.   , PA-C  04/15/20 12:59 PM

## 2020-04-15 ENCOUNTER — Other Ambulatory Visit: Payer: Self-pay

## 2020-04-15 ENCOUNTER — Encounter: Payer: Self-pay | Admitting: Student

## 2020-04-15 ENCOUNTER — Ambulatory Visit: Payer: BC Managed Care – PPO | Admitting: Student

## 2020-04-15 VITALS — BP 130/68 | HR 61 | Ht 69.0 in | Wt 147.0 lb

## 2020-04-15 DIAGNOSIS — E785 Hyperlipidemia, unspecified: Secondary | ICD-10-CM

## 2020-04-15 DIAGNOSIS — I10 Essential (primary) hypertension: Secondary | ICD-10-CM | POA: Diagnosis not present

## 2020-04-15 NOTE — Patient Instructions (Signed)
Medication Instructions:  *If you need a refill on your cardiac medications before your next appointment, please call your pharmacy*  Lab Work: Your physician has recommended that you have lab work today: BMET, CBC, and Lipid Panel  If you have labs (blood work) drawn today and your tests are completely normal, you will receive your results only by: Marland Kitchen MyChart Message (if you have MyChart) OR . A paper copy in the mail If you have any lab test that is abnormal or we need to change your treatment, we will call you to review the results.  Follow-Up: At [facility], you and your health needs are our priority.  As part of our continuing mission to provide you with exceptional heart care, we have created designated Provider Care Teams.  These Care Teams include your primary Cardiologist (physician) and Advanced Practice Providers (APPs -  Physician Assistants and Nurse Practitioners) who all work together to provide you with the care you need, when you need it.  We recommend signing up for the patient portal called "MyChart".  Sign up information is provided on this After Visit Summary.  MyChart is used to connect with patients for Virtual Visits (Telemedicine).  Patients are able to view lab/test results, encounter notes, upcoming appointments, etc.  Non-urgent messages can be sent to your provider as well.   To learn more about what you can do with MyChart, go to [email or web address].    Your next appointment:   Your physician wants you to follow-up in: 1 YEAR with Dr. Ladona Ridgel. You will receive a reminder letter in the mail two months in advance. If you don't receive a letter, please call our office to schedule the follow-up appointment.  The format for your next appointment:   In Person with Lewayne Bunting, MD

## 2020-04-16 LAB — LIPID PANEL
Chol/HDL Ratio: 2.6 ratio (ref 0.0–5.0)
Cholesterol, Total: 142 mg/dL (ref 100–199)
HDL: 54 mg/dL (ref >39)
LDL Chol Calc (NIH): 67 mg/dL (ref 0–99)
Triglycerides: 120 mg/dL (ref 0–149)
VLDL Cholesterol Cal: 21 mg/dL (ref 5–40)

## 2020-04-16 LAB — CBC WITH DIFFERENTIAL/PLATELET
Basophils Absolute: 0.1 10*3/uL (ref 0.0–0.2)
Basos: 1 %
EOS (ABSOLUTE): 0.2 10*3/uL (ref 0.0–0.4)
Eos: 3 %
Hematocrit: 45.2 % (ref 37.5–51.0)
Hemoglobin: 15.4 g/dL (ref 13.0–17.7)
Immature Grans (Abs): 0 10*3/uL (ref 0.0–0.1)
Immature Granulocytes: 0 %
Lymphocytes Absolute: 1.3 10*3/uL (ref 0.7–3.1)
Lymphs: 23 %
MCH: 31.1 pg (ref 26.6–33.0)
MCHC: 34.1 g/dL (ref 31.5–35.7)
MCV: 91 fL (ref 79–97)
Monocytes Absolute: 0.5 10*3/uL (ref 0.1–0.9)
Monocytes: 8 %
Neutrophils Absolute: 3.9 10*3/uL (ref 1.4–7.0)
Neutrophils: 65 %
Platelets: 164 10*3/uL (ref 150–450)
RBC: 4.95 x10E6/uL (ref 4.14–5.80)
RDW: 12.2 % (ref 11.6–15.4)
WBC: 6 10*3/uL (ref 3.4–10.8)

## 2020-04-16 LAB — BASIC METABOLIC PANEL
BUN/Creatinine Ratio: 14 (ref 9–20)
BUN: 16 mg/dL (ref 6–24)
CO2: 28 mmol/L (ref 20–29)
Calcium: 9.3 mg/dL (ref 8.7–10.2)
Chloride: 103 mmol/L (ref 96–106)
Creatinine, Ser: 1.13 mg/dL (ref 0.76–1.27)
GFR calc Af Amer: 84 mL/min/{1.73_m2} (ref >59)
GFR calc non Af Amer: 72 mL/min/{1.73_m2} (ref >59)
Glucose: 53 mg/dL — ABNORMAL LOW (ref 65–99)
Potassium: 4.8 mmol/L (ref 3.5–5.2)
Sodium: 142 mmol/L (ref 134–144)

## 2020-05-13 ENCOUNTER — Other Ambulatory Visit: Payer: Self-pay | Admitting: Internal Medicine

## 2020-05-14 DIAGNOSIS — E039 Hypothyroidism, unspecified: Secondary | ICD-10-CM | POA: Diagnosis not present

## 2020-07-15 DIAGNOSIS — E039 Hypothyroidism, unspecified: Secondary | ICD-10-CM | POA: Diagnosis not present

## 2020-08-06 DIAGNOSIS — Z20822 Contact with and (suspected) exposure to covid-19: Secondary | ICD-10-CM | POA: Diagnosis not present

## 2020-09-09 DIAGNOSIS — E039 Hypothyroidism, unspecified: Secondary | ICD-10-CM | POA: Diagnosis not present

## 2021-01-20 DIAGNOSIS — E039 Hypothyroidism, unspecified: Secondary | ICD-10-CM | POA: Diagnosis not present

## 2021-01-20 DIAGNOSIS — Z5181 Encounter for therapeutic drug level monitoring: Secondary | ICD-10-CM | POA: Diagnosis not present

## 2021-01-20 DIAGNOSIS — Z125 Encounter for screening for malignant neoplasm of prostate: Secondary | ICD-10-CM | POA: Diagnosis not present

## 2021-01-20 DIAGNOSIS — I10 Essential (primary) hypertension: Secondary | ICD-10-CM | POA: Diagnosis not present

## 2021-01-20 DIAGNOSIS — Z0000 Encounter for general adult medical examination without abnormal findings: Secondary | ICD-10-CM | POA: Diagnosis not present

## 2021-01-20 DIAGNOSIS — E7800 Pure hypercholesterolemia, unspecified: Secondary | ICD-10-CM | POA: Diagnosis not present

## 2021-01-30 DIAGNOSIS — N179 Acute kidney failure, unspecified: Secondary | ICD-10-CM | POA: Diagnosis not present

## 2021-03-31 DIAGNOSIS — E039 Hypothyroidism, unspecified: Secondary | ICD-10-CM | POA: Diagnosis not present

## 2021-04-09 DIAGNOSIS — E039 Hypothyroidism, unspecified: Secondary | ICD-10-CM | POA: Diagnosis not present

## 2021-05-04 ENCOUNTER — Other Ambulatory Visit: Payer: Self-pay | Admitting: Internal Medicine

## 2021-05-28 ENCOUNTER — Other Ambulatory Visit: Payer: Self-pay | Admitting: Internal Medicine

## 2021-06-17 ENCOUNTER — Other Ambulatory Visit: Payer: Self-pay | Admitting: Internal Medicine

## 2021-07-09 ENCOUNTER — Other Ambulatory Visit: Payer: Self-pay

## 2021-07-09 MED ORDER — ROSUVASTATIN CALCIUM 20 MG PO TABS: ORAL_TABLET | ORAL | 1 refills | Status: DC

## 2021-07-09 MED ORDER — AMLODIPINE BESYLATE 5 MG PO TABS: ORAL_TABLET | ORAL | 1 refills | Status: DC

## 2021-07-22 ENCOUNTER — Telehealth: Payer: Self-pay | Admitting: Internal Medicine

## 2021-07-22 DIAGNOSIS — L812 Freckles: Secondary | ICD-10-CM | POA: Diagnosis not present

## 2021-07-22 DIAGNOSIS — D2272 Melanocytic nevi of left lower limb, including hip: Secondary | ICD-10-CM | POA: Diagnosis not present

## 2021-07-22 DIAGNOSIS — L821 Other seborrheic keratosis: Secondary | ICD-10-CM | POA: Diagnosis not present

## 2021-07-22 NOTE — Telephone Encounter (Signed)
Error

## 2021-08-01 ENCOUNTER — Other Ambulatory Visit: Payer: Self-pay | Admitting: Internal Medicine

## 2021-09-15 ENCOUNTER — Other Ambulatory Visit: Payer: Self-pay

## 2021-09-15 ENCOUNTER — Encounter: Payer: Self-pay | Admitting: Internal Medicine

## 2021-09-15 ENCOUNTER — Ambulatory Visit: Payer: BC Managed Care – PPO | Admitting: Internal Medicine

## 2021-09-15 VITALS — BP 138/74 | HR 49 | Ht 69.0 in | Wt 152.4 lb

## 2021-09-15 DIAGNOSIS — I10 Essential (primary) hypertension: Secondary | ICD-10-CM

## 2021-09-15 DIAGNOSIS — E785 Hyperlipidemia, unspecified: Secondary | ICD-10-CM

## 2021-09-15 NOTE — Patient Instructions (Addendum)
Medication Instructions:  ?Your physician recommends that you continue on your current medications as directed. Please refer to the Current Medication list given to you today. ? ?Labwork: ?None ordered. ? ?Testing/Procedures: ?You will be scheduled for a calcium scoring test ? ?Follow-Up: ?Your physician wants you to follow-up in: one year with William [place], MD  ?You will receive a reminder letter in the mail two months in advance. If you don't receive a letter, please call our office to schedule the follow-up appointment. ? ?Any Other Special Instructions Will Be Listed Below (If Applicable). ? ?If you need a refill on your cardiac medications before your next appointment, please call your pharmacy.  ? ? ? ? ?

## 2021-09-15 NOTE — Progress Notes (Signed)
? ? ? ? ?HPI ?Mr. Eichelberger returns today for followup. He is a pleasant 61 yo man with a h/o HTN and dyslipidemia and a fairly strong family h/o CAD. The patient runs avidly and is walking the AT and is nearing completion. He has known CAD with an elevated calcium score but a negative stress test years ago. He denies chest pain or sob. He notes that when he is out running his 8 minute mile, he thinks that it is a little more difficult than it used to be. No other symptoms.  ?No Known Allergies ? ? ?Current Outpatient Medications  ?Medication Sig Dispense Refill  ? amLODipine (NORVASC) 5 MG tablet Take 1 tablet (5 mg total) by mouth daily. USE AS DIRECTED 30 tablet 2  ? Artificial Tear Ointment (DRY EYES OP) Apply 1 drop to eye as needed (dry eye).    ? levothyroxine (SYNTHROID) 88 MCG tablet Take 88 mcg by mouth every morning.    ? liothyronine (CYTOMEL) 5 MCG tablet Take 10 mcg by mouth daily.     ? rosuvastatin (CRESTOR) 20 MG tablet USE AS DIRECTED 30 tablet 2  ? ?No current facility-administered medications for this visit.  ? ? ? ?Past Medical History:  ?Diagnosis Date  ? Chest pain 11/24/2012  ? Dyslipidemia 11/24/2012  ? Essential hypertension 11/24/2012  ? Hypertension   ? Leg pain 11/24/2012  ? Thyroid disease   ? hypo  ? ? ?ROS: ? ? All systems reviewed and negative except as noted in the HPI. ? ? ?Past Surgical History:  ?Procedure Laterality Date  ? KNEE SURGERY    ? ? ? ?Family History  ?Problem Relation Age of Onset  ? Heart disease Mother   ? Heart disease Father   ? Heart attack Father   ? ? ? ?Social History  ? ?Socioeconomic History  ? Marital status: Single  ?  Spouse name: Not on file  ? Number of children: Not on file  ? Years of education: Not on file  ? Highest education level: Not on file  ?Occupational History  ? Not on file  ?Tobacco Use  ? Smoking status: Former  ?  Packs/day: 0.50  ?  Years: 2.00  ?  Pack years: 1.00  ?  Types: Cigarettes  ?  Quit date: 06/21/1980  ?  Years since quitting: 41.2  ?  Smokeless tobacco: Former  ?  Types: Snuff  ?  Quit date: 06/21/1980  ?Vaping Use  ? Vaping Use: Never used  ?Substance and Sexual Activity  ? Alcohol use: No  ?  Comment: occassional  ? Drug use: No  ? Sexual activity: Not on file  ?Other Topics Concern  ? Not on file  ?Social History Narrative  ? Not on file  ? ?Social Determinants of Health  ? ?Financial Resource Strain: Not on file  ?Food Insecurity: Not on file  ?Transportation Needs: Not on file  ?Physical Activity: Not on file  ?Stress: Not on file  ?Social Connections: Not on file  ?Intimate Partner Violence: Not on file  ? ? ? ?BP 138/74   Pulse (!) 49   Ht 5\' 9"  (1.753 m)   Wt 152 lb 6.4 oz (69.1 kg)   SpO2 98%   BMI 22.51 kg/m?  ? ?Physical Exam: ? ?Well appearing 61 yo man, NAD ?HEENT: Unremarkable ?Neck:  No JVD, no thyromegally; no bruit ?Lymphatics:  No adenopathy ?Back:  No CVA tenderness ?Lungs:  Clear with no wheezes ?HEART:  Regular  rate rhythm, no murmurs, no rubs, no clicks ?Abd:  soft, positive bowel sounds, no organomegally, no rebound, no guarding ?Ext:  2 plus pulses, no edema, no cyanosis, no clubbing ?Skin:  No rashes no nodules ?Neuro:  CN II through XII intact, motor grossly intact ? ?EKG - sinus bradycardia ? ?Assess/Plan:  ?CAD - non-obstructive based on symptoms. I have recommended he repeat his coronary CT calcium score to see if it has progressed in the past 5 years. He does not have angina but does have an elevated score previously and a strong family history. ?HTN - his bp appears to be well controlled at home. ?Dyslipidemia - we will base the dose of crestor on whether or not his calcium score is going up significantly. ?Chest pain - non since he got a pill stuck in his esophagus several years ago. We will follow. ? ?Sharlot Gowda Elmo Rio,MD ?

## 2021-10-05 ENCOUNTER — Ambulatory Visit: Payer: BC Managed Care – PPO | Admitting: Internal Medicine

## 2021-10-26 ENCOUNTER — Other Ambulatory Visit: Payer: Self-pay

## 2021-10-26 ENCOUNTER — Ambulatory Visit
Admission: RE | Admit: 2021-10-26 | Discharge: 2021-10-26 | Disposition: A | Discharge status: Home / Self Care | Payer: Self-pay | Source: Ambulatory Visit | Attending: Internal Medicine | Admitting: Internal Medicine

## 2021-10-26 DIAGNOSIS — E785 Hyperlipidemia, unspecified: Secondary | ICD-10-CM

## 2021-10-26 MED ORDER — AMLODIPINE BESYLATE 5 MG PO TABS: 5.0000 mg | ORAL_TABLET | Freq: Every day | ORAL | 3 refills | Status: DC

## 2021-10-26 MED ORDER — ROSUVASTATIN CALCIUM 20 MG PO TABS: ORAL_TABLET | ORAL | 3 refills | Status: DC

## 2021-11-02 ENCOUNTER — Telehealth: Payer: Self-pay

## 2021-11-02 DIAGNOSIS — E785 Hyperlipidemia, unspecified: Secondary | ICD-10-CM

## 2021-11-02 NOTE — Telephone Encounter (Signed)
-----   Message from Evans Lance, MD sent at 10/29/2021  9:54 PM EDT ----- ?Order lipo med, Apo - B and LPa levels. I'll reach out and discuss calcium score with him ?

## 2021-11-04 NOTE — Telephone Encounter (Signed)
Left detailed message requesting call back to schedule fasting labs. ?

## 2021-11-11 ENCOUNTER — Other Ambulatory Visit: Payer: BC Managed Care – PPO | Admitting: *Deleted

## 2021-11-11 DIAGNOSIS — E785 Hyperlipidemia, unspecified: Secondary | ICD-10-CM | POA: Diagnosis not present

## 2021-11-12 LAB — NMR, LIPOPROFILE
Cholesterol, Total: 143 mg/dL (ref 100–199)
HDL Particle Number: 37 umol/L (ref >=30.5)
HDL-C: 50 mg/dL (ref >39)
LDL Particle Number: 922 nmol/L (ref <1000)
LDL Size: 20.1 nm — ABNORMAL LOW (ref >20.5)
LDL-C (NIH Calc): 69 mg/dL (ref 0–99)
LP-IR Score: 64 — ABNORMAL HIGH (ref <=45)
Small LDL Particle Number: 606 nmol/L — ABNORMAL HIGH (ref <=527)
Triglycerides: 137 mg/dL (ref 0–149)

## 2021-11-12 LAB — APOLIPOPROTEIN B: Apolipoprotein B: 73 mg/dL (ref <90)

## 2021-11-12 LAB — LIPOPROTEIN A (LPA): Lipoprotein (a): 13 nmol/L (ref <75.0)

## 2021-11-18 NOTE — Telephone Encounter (Signed)
Left 2nd message requesting call back for Pt to schedule fasting labs.  Orders entered.  Please schedule lab appt.

## 2021-11-20 ENCOUNTER — Telehealth: Payer: Self-pay

## 2021-11-20 DIAGNOSIS — E785 Hyperlipidemia, unspecified: Secondary | ICD-10-CM

## 2021-11-20 NOTE — Telephone Encounter (Signed)
-----   Message from Marinus Maw, MD sent at 11/19/2021  8:34 PM EDT ----- [place] When he gets back in two weeks, I would like him to increase his  crestor to 40 mg daily. Check a liver panel, fasting lipids in 30months.

## 2021-12-09 MED ORDER — ROSUVASTATIN CALCIUM 40 MG PO TABS: 40.0000 mg | ORAL_TABLET | Freq: Every day | ORAL | 3 refills | Status: DC

## 2021-12-09 NOTE — Telephone Encounter (Signed)
Left detailed message advising Pt to change Crestor dosage to 40 mg PO daily.    Will need lab work in 4 months.   Will schedule and mail to Pt.

## 2022-03-25 DIAGNOSIS — Z23 Encounter for immunization: Secondary | ICD-10-CM | POA: Diagnosis not present

## 2022-04-07 ENCOUNTER — Ambulatory Visit: Payer: BC Managed Care – PPO | Attending: Internal Medicine

## 2022-04-07 DIAGNOSIS — E785 Hyperlipidemia, unspecified: Secondary | ICD-10-CM | POA: Diagnosis not present

## 2022-04-07 LAB — LIPID PANEL
Chol/HDL Ratio: 3.2 ratio (ref 0.0–5.0)
Cholesterol, Total: 154 mg/dL (ref 100–199)
HDL: 48 mg/dL (ref >39)
LDL Chol Calc (NIH): 81 mg/dL (ref 0–99)
Triglycerides: 141 mg/dL (ref 0–149)
VLDL Cholesterol Cal: 25 mg/dL (ref 5–40)

## 2022-04-07 LAB — HEPATIC FUNCTION PANEL
ALT: 38 IU/L (ref 0–44)
AST: 49 IU/L — ABNORMAL HIGH (ref 0–40)
Albumin: 4.5 g/dL (ref 3.8–4.9)
Alkaline Phosphatase: 63 IU/L (ref 44–121)
Bilirubin Total: 0.5 mg/dL (ref 0.0–1.2)
Bilirubin, Direct: 0.16 mg/dL (ref 0.00–0.40)
Total Protein: 6.4 g/dL (ref 6.0–8.5)

## 2022-04-28 ENCOUNTER — Ambulatory Visit: Payer: BC Managed Care – PPO | Attending: Cardiology | Admitting: Internal Medicine

## 2022-04-28 DIAGNOSIS — I25119 Atherosclerotic heart disease of native coronary artery with unspecified angina pectoris: Secondary | ICD-10-CM

## 2022-04-28 NOTE — Progress Notes (Signed)
Electrophysiology TeleHealth Note   Date:  04/28/2022   ID:  William Mcmillan, DOB 1960/09/10, MRN [id number]  Location: patient's home  Provider location: [address] [place]  Evaluation Performed: Follow-up visit  PCP:  Kirby Funk, MD  Cardiologist:  Ladona Ridgel Electrophysiologist: N/A  Chief Complaint:  "I am not running as well."  History of Present Illness:    William Mcmillan is a 61 y.o. male who presents via audio/video conferencing for a telehealth visit today.  He is a pleasant 61 yo man with a h/o HTN and thyroid dysfunction on supplemental thyroid replacement. The patient has a strong family history of CAD with both parents requiring CABG in their 27's. His paternal grandfather died of an MI at 51. His brother has had 2 MI's in his 29's. The patient had been asymptomatic for years and has been very active running regularly, even running in marathons in the past. He has an elevated coronary calcium score which has climbed over the past few years from 139, 5 years ago to 551 (94%). Over the years he has had episodes of non-exertional chest pain. Most concerning is that over the past few months his ability to exercise has changed. He notes that he feels a sense of malaise when he goes out for a run. He is simply not physically able to exert himself. He denies typical angina. Maybe a little sob. When he feels this sensation he slows way down and the sensation improves. He denies fever or chills. No neuro changes. Despite being on maximal statin therapy, he is still not at goal and in fact the LDL has gone up while his transaminases iare also up with the higher dose of statin. Past Medical History:  Diagnosis Date   Chest pain 11/24/2012   Dyslipidemia 11/24/2012   Essential hypertension 11/24/2012   Hypertension    Leg pain 11/24/2012   Thyroid disease    hypo    Past Surgical History:  Procedure Laterality Date   KNEE SURGERY      Current Outpatient Medications   Medication Sig Dispense Refill   amLODipine (NORVASC) 5 MG tablet Take 1 tablet (5 mg total) by mouth daily. USE AS DIRECTED 90 tablet 3   Artificial Tear Ointment (DRY EYES OP) Apply 1 drop to eye as needed (dry eye).     levothyroxine (SYNTHROID) 88 MCG tablet Take 88 mcg by mouth every morning.     liothyronine (CYTOMEL) 5 MCG tablet Take 10 mcg by mouth daily.      rosuvastatin (CRESTOR) 40 MG tablet Take 1 tablet (40 mg total) by mouth daily. 90 tablet 3   No current facility-administered medications for this visit.    Allergies:   Patient has no known allergies.   Social History:  The patient does not smoke. He drinks 3-5 beers a week. Family History:  The patient's family history includes Heart attack in his father; Heart disease in his father and mother. His brother has had 2 MI's.  ROS:  Please see the history of present illness.   All other systems are personally reviewed and negative.    Exam:    Vital Signs:  There were no vitals taken for this visit.  Well appearing, alert and conversant.   Labs/Other Tests and Data Reviewed:    Recent Labs: 04/07/2022: ALT 38   Wt Readings from Last 3 Encounters:  09/15/21 152 lb 6.4 oz (69.1 kg)  04/15/20 147 lb (66.7 kg)  11/11/18 155 lb (  70.3 kg)     Other studies personally reviewed: Additional studies/ records that were reviewed today include: old ECG's and stress test  Review of the above records today demonstrates: as above Prior radiographs: CT scan reviewed   ASSESSMENT & PLAN:    1.  Progressively worsening CAD - I am concerned that the patient's inabililty to exercise represents his anginal equivalent. He has a very strong family history and despite maximal medical therapy, his calcium score on CT scanning is actually progressing and now 94% for age. I have recommended he undergo coronary CT angiogram. We will schedule in coming weeks.  2. Dyslipidemia - as he is not at goal on maximal statin dosing with 40 mg  of crestor, I will plan to start him on repatha.  3. HTN - his bp has mostly been controlled on medical therapy as above.   Follow-up:  will be based on the results of his coronary CT angio.  Next remote: n/a   Labs/ tests ordered today include: we will order a coronary ct angio  Today, I have spent 20 minutes with the patient with telehealth technology discussing all of the above .    Signed, Lewayne Bunting, MD  04/28/2022 11:51 AM     [facility] [address] [place] [address] [phone] (office) [phone] (fax)

## 2022-04-29 ENCOUNTER — Other Ambulatory Visit (HOSPITAL_COMMUNITY): Payer: Self-pay | Admitting: Emergency Medicine

## 2022-04-29 ENCOUNTER — Other Ambulatory Visit: Payer: Self-pay | Admitting: Internal Medicine

## 2022-04-29 DIAGNOSIS — I2510 Atherosclerotic heart disease of native coronary artery without angina pectoris: Secondary | ICD-10-CM

## 2022-04-30 ENCOUNTER — Encounter (HOSPITAL_COMMUNITY): Payer: Self-pay

## 2022-05-10 ENCOUNTER — Telehealth (HOSPITAL_COMMUNITY): Payer: Self-pay | Admitting: *Deleted

## 2022-05-10 NOTE — Telephone Encounter (Signed)
Attempted to call patient regarding upcoming cardiac CT appointment. °Left message on voicemail with name and callback number ° °Takyah Ciaramitaro RN Navigator Cardiac Imaging ° [facility] °[phone] Office °[phone] Cell ° °

## 2022-05-11 ENCOUNTER — Ambulatory Visit (HOSPITAL_COMMUNITY)
Admission: RE | Admit: 2022-05-11 | Discharge: 2022-05-11 | Disposition: A | Discharge status: Home / Self Care | Payer: BC Managed Care – PPO | Source: Ambulatory Visit | Attending: Internal Medicine | Admitting: Internal Medicine

## 2022-05-11 ENCOUNTER — Other Ambulatory Visit: Payer: Self-pay | Admitting: Internal Medicine

## 2022-05-11 DIAGNOSIS — R931 Abnormal findings on diagnostic imaging of heart and coronary circulation: Secondary | ICD-10-CM

## 2022-05-11 DIAGNOSIS — I2510 Atherosclerotic heart disease of native coronary artery without angina pectoris: Secondary | ICD-10-CM | POA: Diagnosis not present

## 2022-05-11 MED ORDER — NITROGLYCERIN 0.4 MG SL SUBL
0.8000 mg | SUBLINGUAL_TABLET | Freq: Once | SUBLINGUAL | Status: AC
  Administered 2022-05-11: 0.8 mg via SUBLINGUAL

## 2022-05-11 MED ORDER — NITROGLYCERIN 0.4 MG SL SUBL
SUBLINGUAL_TABLET | SUBLINGUAL | Status: AC
  Filled 2022-05-11: qty 2

## 2022-05-11 MED ORDER — IOHEXOL 350 MG/ML SOLN
100.0000 mL | Freq: Once | INTRAVENOUS | Status: AC | PRN
  Administered 2022-05-11: 100 mL via INTRAVENOUS

## 2022-05-12 ENCOUNTER — Ambulatory Visit (HOSPITAL_BASED_OUTPATIENT_CLINIC_OR_DEPARTMENT_OTHER)
Admission: RE | Admit: 2022-05-12 | Discharge: 2022-05-12 | Disposition: A | Discharge status: Home / Self Care | Payer: BC Managed Care – PPO | Source: Ambulatory Visit | Attending: Internal Medicine | Admitting: Internal Medicine

## 2022-05-12 DIAGNOSIS — I2510 Atherosclerotic heart disease of native coronary artery without angina pectoris: Secondary | ICD-10-CM | POA: Diagnosis not present

## 2022-05-12 DIAGNOSIS — R931 Abnormal findings on diagnostic imaging of heart and coronary circulation: Secondary | ICD-10-CM | POA: Diagnosis not present

## 2022-05-24 ENCOUNTER — Encounter: Payer: Self-pay | Admitting: Cardiovascular Disease

## 2022-05-24 ENCOUNTER — Ambulatory Visit: Payer: BC Managed Care – PPO | Attending: Cardiovascular Disease | Admitting: Cardiovascular Disease

## 2022-05-24 VITALS — BP 126/80 | HR 52 | Ht 69.0 in | Wt 150.2 lb

## 2022-05-24 DIAGNOSIS — R931 Abnormal findings on diagnostic imaging of heart and coronary circulation: Secondary | ICD-10-CM | POA: Diagnosis not present

## 2022-05-24 DIAGNOSIS — I25119 Atherosclerotic heart disease of native coronary artery with unspecified angina pectoris: Secondary | ICD-10-CM | POA: Diagnosis not present

## 2022-05-24 MED ORDER — SODIUM CHLORIDE 0.9% FLUSH: 3.0000 mL | Freq: Two times a day (BID) | INTRAVENOUS | Status: DC

## 2022-05-24 NOTE — Progress Notes (Signed)
Chief Complaint  Patient presents with   New Patient (Initial Visit)    CAD   History of Present Illness: 61 yo male with history of HTN, hyperlipidemia, hypothyroidism and recent finding of CAD who is here as a new consult, referred by Dr. Ladona Ridgel, for further discussion regarding his CAD. He has been followed in our office by Dr. Ladona Ridgel who is also his neighbor. Recent abnormal coronary calcium score. Coronary CTA with at least moderate disease in all three coronary arteries. He has just completed the [employer]. Over the past few months, he describes progressive fatigue and dyspnea with moderate exertion. He is a runner. No chest pain with exertion.   Primary Care Physician: Kirby Funk, MD   Past Medical History:  Diagnosis Date   Chest pain 11/24/2012   Dyslipidemia 11/24/2012   Essential hypertension 11/24/2012   Leg pain 11/24/2012   Thyroid disease    hypo    Past Surgical History:  Procedure Laterality Date   KNEE SURGERY      Current Outpatient Medications  Medication Sig Dispense Refill   amLODipine (NORVASC) 5 MG tablet Take 1 tablet (5 mg total) by mouth daily. USE AS DIRECTED 90 tablet 3   Artificial Tear Ointment (DRY EYES OP) Apply 1 drop to eye as needed (dry eye).     levothyroxine (SYNTHROID) 88 MCG tablet Take 88 mcg by mouth every morning.     liothyronine (CYTOMEL) 5 MCG tablet Take 10 mcg by mouth daily.      rosuvastatin (CRESTOR) 40 MG tablet Take 1 tablet (40 mg total) by mouth daily. 90 tablet 3   Current Facility-Administered Medications  Medication Dose Route Frequency Provider Last Rate Last Admin   sodium chloride flush (NS) 0.9 % injection 3 mL  3 mL Intravenous Q12H Elhadji Pecore, Nile Dear, MD        No Known Allergies  Social History   Socioeconomic History   Marital status: Married    Spouse name: Not on file   Number of children: 3   Years of education: Not on file   Highest education level: Not on file  Occupational  History   Occupation: Works for family [employer] company  Tobacco Use   Smoking status: Former    Packs/day: 0.50    Years: 2.00    Total pack years: 1.00    Types: Cigarettes    Quit date: 06/21/1980    Years since quitting: 41.9   Smokeless tobacco: Former    Types: Snuff    Quit date: 06/21/1980  Vaping Use   Vaping Use: Never used  Substance and Sexual Activity   Alcohol use: No    Comment: occassional   Drug use: No   Sexual activity: Not on file  Other Topics Concern   Not on file  Social History Narrative   Not on file   Social Determinants of Health   Financial Resource Strain: Not on file  Food Insecurity: Not on file  Transportation Needs: Not on file  Physical Activity: Not on file  Stress: Not on file  Social Connections: Not on file  Intimate Partner Violence: Not on file    Family History  Problem Relation Age of Onset   Heart disease Mother    Heart disease Father    Heart attack Father 63       Died age 77   Coronary artery disease Brother        One brother with CABG, two other brothers with  CAD and stents    Review of Systems:  As stated in the HPI and otherwise negative.   BP 126/80   Pulse (!) 52   Ht 5\' 9"  (1.753 m)   Wt 150 lb 3.2 oz (68.1 kg)   SpO2 99%   BMI 22.18 kg/m   Physical Examination: General: Well developed, well nourished, NAD  HEENT: OP clear, mucus membranes moist  SKIN: warm, dry. No rashes. Neuro: No focal deficits  Musculoskeletal: Muscle strength 5/5 all ext  Psychiatric: Mood and affect normal  Neck: No JVD, no carotid bruits, no thyromegaly, no lymphadenopathy.  Lungs:Clear bilaterally, no wheezes, rhonci, crackles Cardiovascular: Regular rate and rhythm. No murmurs, gallops or rubs. Abdomen:Soft. Bowel sounds present. Non-tender.  Extremities: No lower extremity edema. Pulses are 2 + in the bilateral DP/PT.  EKG:  EKG is ordered today. The ekg ordered today demonstrates Sinus bradycardia, rate 52 bpm  Recent  Labs: 04/07/2022: ALT 38   Lipid Panel    Component Value Date/Time   CHOL 154 04/07/2022 0721   TRIG 141 04/07/2022 0721   HDL 48 04/07/2022 0721   CHOLHDL 3.2 04/07/2022 0721   CHOLHDL 4 02/27/2015 0757   VLDL 27.8 02/27/2015 0757   LDLCALC 81 04/07/2022 0721   LDLDIRECT 141.5 09/22/2010 0858     Wt Readings from Last 3 Encounters:  05/24/22 150 lb 3.2 oz (68.1 kg)  09/15/21 152 lb 6.4 oz (69.1 kg)  04/15/20 147 lb (66.7 kg)    Assessment and Plan:   1. CAD without angina: Coronary CTA with possible severe multi-vessel CAD. He reports progressive fatigue and dyspnea with moderate exertion. Strong family history of premature CAD. I think a cardiac cath is indicated.   -Cardiac cath at [facility] on [facility]07[facility].   - I have reviewed the risks, indications, and alternatives to cardiac catheterization, possible angioplasty, and stenting with the patient. Risks include but are not limited to bleeding, infection, vascular injury, stroke, myocardial infection, arrhythmia, kidney injury, radiation-related injury in the case of prolonged fluoroscopy use, emergency cardiac surgery, and death. The patient understands the risks of serious complication is 1-2 in [id number] with diagnostic cardiac cath and 1-2% or less with angioplasty/stenting.  -BMET and CBC today  Labs/ tests ordered today include:   Orders Placed This Encounter  Procedures   Basic metabolic panel   CBC   EKG 12-Lead   Disposition:   F/U with me 2-3 weeks post cath  Signed, Lauree Chandler, MD, [facility] 05/24/2022 4:25 PM    [facility] [address], [place], [place]  [address] Phone: [phone]; Fax: [phone]

## 2022-05-24 NOTE — H&P (View-Only) (Signed)
Chief Complaint  Patient presents with   New Patient (Initial Visit)    CAD   History of Present Illness: 61 yo male with history of HTN, hyperlipidemia, hypothyroidism and recent finding of CAD who is here as a new consult, referred by Dr. Ladona Ridgel, for further discussion regarding his CAD. He has been followed in our office by Dr. Ladona Ridgel who is also his neighbor. Recent abnormal coronary calcium score. Coronary CTA with at least moderate disease in all three coronary arteries. He has just completed the [employer]. Over the past few months, he describes progressive fatigue and dyspnea with moderate exertion. He is a runner. No chest pain with exertion.   Primary Care Physician: Kirby Funk, MD   Past Medical History:  Diagnosis Date   Chest pain 11/24/2012   Dyslipidemia 11/24/2012   Essential hypertension 11/24/2012   Leg pain 11/24/2012   Thyroid disease    hypo    Past Surgical History:  Procedure Laterality Date   KNEE SURGERY      Current Outpatient Medications  Medication Sig Dispense Refill   amLODipine (NORVASC) 5 MG tablet Take 1 tablet (5 mg total) by mouth daily. USE AS DIRECTED 90 tablet 3   Artificial Tear Ointment (DRY EYES OP) Apply 1 drop to eye as needed (dry eye).     levothyroxine (SYNTHROID) 88 MCG tablet Take 88 mcg by mouth every morning.     liothyronine (CYTOMEL) 5 MCG tablet Take 10 mcg by mouth daily.      rosuvastatin (CRESTOR) 40 MG tablet Take 1 tablet (40 mg total) by mouth daily. 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  Tobacco Use   Smoking status: Former    Packs/day: 0.50    Years: 2.00    Total pack years: 1.00    Types: Cigarettes    Quit date: 06/21/1980    Years since quitting: 41.9   Smokeless tobacco: Former    Types: Snuff    Quit date: 06/21/1980  Vaping Use   Vaping Use: Never used  Substance and Sexual Activity   Alcohol use: No    Comment: occassional   Drug use: No   Sexual activity: Not on file  Other Topics Concern   Not on file  Social History Narrative   Not on file   Social Determinants of Health   Financial Resource Strain: Not on file  Food Insecurity: Not on file  Transportation Needs: Not on file  Physical Activity: Not on file  Stress: Not on file  Social Connections: Not on file  Intimate Partner Violence: Not on file    Family History  Problem Relation Age of Onset   Heart disease Mother    Heart disease Father    Heart attack Father 63       Died age 77   Coronary artery disease Brother        One brother with CABG, two other brothers with  CAD and stents    Review of Systems:  As stated in the HPI and otherwise negative.   BP 126/80   Pulse (!) 52   Ht 5' 9" (1.753 m)   Wt 150 lb 3.2 oz (68.1 kg)   SpO2 99%   BMI 22.18 kg/m   Physical Examination: General: Well developed, well nourished, NAD  HEENT: OP clear, mucus membranes moist  SKIN: warm, dry. No rashes. Neuro: No focal deficits  Musculoskeletal: Muscle strength 5/5 all ext  Psychiatric: Mood and affect normal  Neck: No JVD, no carotid bruits, no thyromegaly, no lymphadenopathy.  Lungs:Clear bilaterally, no wheezes, rhonci, crackles Cardiovascular: Regular rate and rhythm. No murmurs, gallops or rubs. Abdomen:Soft. Bowel sounds present. Non-tender.  Extremities: No lower extremity edema. Pulses are 2 + in the bilateral DP/PT.  EKG:  EKG is ordered today. The ekg ordered today demonstrates Sinus bradycardia, rate 52 bpm  Recent  Labs: 04/07/2022: ALT 38   Lipid Panel    Component Value Date/Time   CHOL 154 04/07/2022 0721   TRIG 141 04/07/2022 0721   HDL 48 04/07/2022 0721   CHOLHDL 3.2 04/07/2022 0721   CHOLHDL 4 02/27/2015 0757   VLDL 27.8 02/27/2015 0757   LDLCALC 81 04/07/2022 0721   LDLDIRECT 141.5 09/22/2010 0858     Wt Readings from Last 3 Encounters:  05/24/22 150 lb 3.2 oz (68.1 kg)  09/15/21 152 lb 6.4 oz (69.1 kg)  04/15/20 147 lb (66.7 kg)    Assessment and Plan:   1. CAD without angina: Coronary CTA with possible severe multi-vessel CAD. He reports progressive fatigue and dyspnea with moderate exertion. Strong family history of premature CAD. I think a cardiac cath is indicated.   -[facility]   - I have reviewed the risks, indications, and alternatives to cardiac catheterization, possible angioplasty, and stenting with the patient. Risks include but are not limited to bleeding, infection, vascular injury, stroke, myocardial infection, arrhythmia, kidney injury, radiation-related injury in the case of prolonged fluoroscopy use, emergency cardiac surgery, and death. The patient understands the risks of serious complication is 1-2 in 1000 with diagnostic cardiac cath and 1-2% or less with angioplasty/stenting.  -BMET and CBC today  Labs/ tests ordered today include:   Orders Placed This Encounter  Procedures   Basic metabolic panel   CBC   EKG 12-Lead   Disposition:   F/U with me 2-3 weeks post cath  Signed, Jenni Thew, MD, FACC 05/24/2022 4:25 PM    [facility] [address], [place], [place]  [address] Phone: [phone]; Fax: [phone]    

## 2022-05-24 NOTE — Patient Instructions (Signed)
Medication Instructions:  No changes *If you need a refill on your cardiac medications before your next appointment, please call your pharmacy*   Lab Work: Today: cbc, bmet   Testing/Procedures: Your physician has requested that you have a cardiac catheterization. Cardiac catheterization is used to diagnose and/or treat various heart conditions. Doctors may recommend this procedure for a number of different reasons. The most common reason is to evaluate chest pain. Chest pain can be a symptom of coronary artery disease (CAD), and cardiac catheterization can show whether plaque is narrowing or blocking your heart's arteries. This procedure is also used to evaluate the valves, as well as measure the blood flow and oxygen levels in different parts of your heart. For further information please visit [email or web address] Please follow instruction sheet, as given.   Follow-Up: At [facility], you and your health needs are our priority.  As part of our continuing mission to provide you with exceptional heart care, we have created designated Provider Care Teams.  These Care Teams include your primary Cardiologist (physician) and Advanced Practice Providers (APPs -  Physician Assistants and Nurse Practitioners) who all work together to provide you with the care you need, when you need it.   Your next appointment:   4 week(s)  The format for your next appointment:   In Person  Provider:   Verne Carrow, MD  Other Instructions       Cardiac/Peripheral Catheterization   You are scheduled for a Cardiac Catheterization on Monday, December 18 with Dr. Verne Carrow.  1. Please arrive at the Main Entrance A at [facility]: [address] [place], [place] [address] on December 18 at 7:00 AM (This time is two hours before your procedure to ensure your preparation). Free valet parking service is available. You will check in at ADMITTING. The support person will be  asked to wait in the waiting room.  It is OK to have someone drop you off and come back when you are ready to be discharged.        Special note: Every effort is made to have your procedure done on time. Please understand that emergencies sometimes delay scheduled procedures.   . 2. Diet: Do not eat solid foods after midnight.  You may have clear liquids until 5 AM the day of the procedure.  3. Labs: You will need to have blood drawn today.  You do not need to be fasting.  4. Medication instructions in preparation for your procedure:   Contrast Allergy: No   On the morning of your procedure, take Aspirin 81 mg and any morning medicines NOT listed above.  You may use sips of water.  5. Plan to go home the same day, you will only stay overnight if medically necessary. 6. You MUST have a responsible adult to drive you home. 7. An adult MUST be with you the first 24 hours after you arrive home. 8. Bring a current list of your medications, and the last time and date medication taken. 9. Bring ID and current insurance cards. 10.Please wear clothes that are easy to get on and off and wear slip-on shoes.  Thank you for allowing [place] to care for you!   -- [facility]

## 2022-05-25 LAB — CBC
Hematocrit: 42 % (ref 37.5–51.0)
Hemoglobin: 13.5 g/dL (ref 13.0–17.7)
MCH: 26.5 pg — ABNORMAL LOW (ref 26.6–33.0)
MCHC: 32.1 g/dL (ref 31.5–35.7)
MCV: 82 fL (ref 79–97)
Platelets: 192 10*3/uL (ref 150–450)
RBC: 5.1 x10E6/uL (ref 4.14–5.80)
RDW: 21.1 % — ABNORMAL HIGH (ref 11.6–15.4)
WBC: 5.2 10*3/uL (ref 3.4–10.8)

## 2022-05-25 LAB — BASIC METABOLIC PANEL
BUN/Creatinine Ratio: 11 (ref 9–20)
BUN: 12 mg/dL (ref 6–24)
CO2: 25 mmol/L (ref 20–29)
Calcium: 9.4 mg/dL (ref 8.7–10.2)
Chloride: 104 mmol/L (ref 96–106)
Creatinine, Ser: 1.06 mg/dL (ref 0.76–1.27)
Glucose: 90 mg/dL (ref 70–99)
Potassium: 4.5 mmol/L (ref 3.5–5.2)
Sodium: 144 mmol/L (ref 134–144)
eGFR: 81 mL/min/{1.73_m2} (ref >59)

## 2022-06-03 ENCOUNTER — Telehealth: Payer: Self-pay | Admitting: *Deleted

## 2022-06-03 NOTE — Telephone Encounter (Signed)
Cardiac Catheterization scheduled at [facility] for: Monday June 07, 2022 9 AM Arrival time and place: [facility] Main Entrance A at: 7 AM  Nothing to eat after midnight prior to procedure, clear liquids until 5 AM day of procedure.  Medication instructions: -Usual morning medications can be taken with sips of water including aspirin 81 mg.  Confirmed patient has responsible adult to drive home post procedure and be with patient first 24 hours after arriving home.  Patient reports no new symptoms concerning for COVID-19 in the past 10 days.  Reviewed procedure instructions with patient.

## 2022-06-07 ENCOUNTER — Telehealth: Payer: Self-pay | Admitting: *Deleted

## 2022-06-07 ENCOUNTER — Encounter (HOSPITAL_COMMUNITY): Payer: Self-pay | Admitting: Cardiovascular Disease

## 2022-06-07 ENCOUNTER — Ambulatory Visit (HOSPITAL_COMMUNITY)
Admission: RE | Admit: 2022-06-07 | Discharge: 2022-06-07 | Disposition: A | Discharge status: Home / Self Care | Payer: BC Managed Care – PPO | Attending: Cardiovascular Disease | Admitting: Cardiovascular Disease

## 2022-06-07 ENCOUNTER — Other Ambulatory Visit: Payer: Self-pay

## 2022-06-07 ENCOUNTER — Encounter (HOSPITAL_COMMUNITY)
Admission: RE | Disposition: A | Discharge status: Home / Self Care | Payer: Self-pay | Source: Home / Self Care | Attending: Cardiovascular Disease

## 2022-06-07 DIAGNOSIS — E785 Hyperlipidemia, unspecified: Secondary | ICD-10-CM | POA: Insufficient documentation

## 2022-06-07 DIAGNOSIS — Z87891 Personal history of nicotine dependence: Secondary | ICD-10-CM | POA: Insufficient documentation

## 2022-06-07 DIAGNOSIS — Z8249 Family history of ischemic heart disease and other diseases of the circulatory system: Secondary | ICD-10-CM | POA: Insufficient documentation

## 2022-06-07 DIAGNOSIS — I25119 Atherosclerotic heart disease of native coronary artery with unspecified angina pectoris: Secondary | ICD-10-CM

## 2022-06-07 DIAGNOSIS — E039 Hypothyroidism, unspecified: Secondary | ICD-10-CM | POA: Diagnosis not present

## 2022-06-07 DIAGNOSIS — I25118 Atherosclerotic heart disease of native coronary artery with other forms of angina pectoris: Secondary | ICD-10-CM | POA: Diagnosis not present

## 2022-06-07 DIAGNOSIS — I10 Essential (primary) hypertension: Secondary | ICD-10-CM | POA: Insufficient documentation

## 2022-06-07 HISTORY — PX: LEFT HEART CATH AND CORONARY ANGIOGRAPHY: CATH118249

## 2022-06-07 SURGERY — LEFT HEART CATH AND CORONARY ANGIOGRAPHY
Anesthesia: LOCAL

## 2022-06-07 MED ORDER — SODIUM CHLORIDE 0.9 % IV SOLN: INTRAVENOUS | Status: AC

## 2022-06-07 MED ORDER — IOHEXOL 350 MG/ML SOLN
INTRAVENOUS | Status: DC | PRN
  Administered 2022-06-07: 65 mL

## 2022-06-07 MED ORDER — FENTANYL CITRATE (PF) 100 MCG/2ML IJ SOLN
INTRAMUSCULAR | Status: DC | PRN
  Administered 2022-06-07: 50 ug via INTRAVENOUS

## 2022-06-07 MED ORDER — VERAPAMIL HCL 2.5 MG/ML IV SOLN
INTRAVENOUS | Status: AC
  Filled 2022-06-07: qty 2

## 2022-06-07 MED ORDER — FENTANYL CITRATE (PF) 100 MCG/2ML IJ SOLN
INTRAMUSCULAR | Status: AC
  Filled 2022-06-07: qty 2

## 2022-06-07 MED ORDER — EZETIMIBE 10 MG PO TABS: 10.0000 mg | ORAL_TABLET | Freq: Every day | ORAL | 3 refills | Status: DC

## 2022-06-07 MED ORDER — LABETALOL HCL 5 MG/ML IV SOLN: 10.0000 mg | INTRAVENOUS | Status: DC | PRN

## 2022-06-07 MED ORDER — VERAPAMIL HCL 2.5 MG/ML IV SOLN
INTRAVENOUS | Status: DC | PRN
  Administered 2022-06-07: 10 mL via INTRA_ARTERIAL

## 2022-06-07 MED ORDER — SODIUM CHLORIDE 0.9 % WEIGHT BASED INFUSION
3.0000 mL/kg/h | INTRAVENOUS | Status: AC
  Administered 2022-06-07: 3 mL/kg/h via INTRAVENOUS

## 2022-06-07 MED ORDER — MIDAZOLAM HCL 2 MG/2ML IJ SOLN
INTRAMUSCULAR | Status: AC
  Filled 2022-06-07: qty 2

## 2022-06-07 MED ORDER — SODIUM CHLORIDE 0.9% FLUSH: 3.0000 mL | INTRAVENOUS | Status: DC | PRN

## 2022-06-07 MED ORDER — ACETAMINOPHEN 325 MG PO TABS: 650.0000 mg | ORAL_TABLET | ORAL | Status: DC | PRN

## 2022-06-07 MED ORDER — MIDAZOLAM HCL 2 MG/2ML IJ SOLN
INTRAMUSCULAR | Status: DC | PRN
  Administered 2022-06-07: 2 mg via INTRAVENOUS

## 2022-06-07 MED ORDER — HEPARIN (PORCINE) IN NACL 1000-0.9 UT/500ML-% IV SOLN
INTRAVENOUS | Status: DC | PRN
  Administered 2022-06-07 (×2): 500 mL

## 2022-06-07 MED ORDER — HEPARIN (PORCINE) IN NACL 1000-0.9 UT/500ML-% IV SOLN
INTRAVENOUS | Status: AC
  Filled 2022-06-07: qty 1000

## 2022-06-07 MED ORDER — LIDOCAINE HCL (PF) 1 % IJ SOLN
INTRAMUSCULAR | Status: AC
  Filled 2022-06-07: qty 30

## 2022-06-07 MED ORDER — HEPARIN SODIUM (PORCINE) 1000 UNIT/ML IJ SOLN
INTRAMUSCULAR | Status: AC
  Filled 2022-06-07: qty 10

## 2022-06-07 MED ORDER — SODIUM CHLORIDE 0.9 % WEIGHT BASED INFUSION: 1.0000 mL/kg/h | INTRAVENOUS | Status: DC

## 2022-06-07 MED ORDER — ASPIRIN 81 MG PO CHEW: 81.0000 mg | CHEWABLE_TABLET | ORAL | Status: DC

## 2022-06-07 MED ORDER — SODIUM CHLORIDE 0.9 % IV SOLN: 250.0000 mL | INTRAVENOUS | Status: DC | PRN

## 2022-06-07 MED ORDER — SODIUM CHLORIDE 0.9% FLUSH: 3.0000 mL | Freq: Two times a day (BID) | INTRAVENOUS | Status: DC

## 2022-06-07 MED ORDER — HEPARIN SODIUM (PORCINE) 1000 UNIT/ML IJ SOLN
INTRAMUSCULAR | Status: DC | PRN
  Administered 2022-06-07: 3500 [IU] via INTRAVENOUS

## 2022-06-07 MED ORDER — ONDANSETRON HCL 4 MG/2ML IJ SOLN: 4.0000 mg | Freq: Four times a day (QID) | INTRAMUSCULAR | Status: DC | PRN

## 2022-06-07 MED ORDER — HYDRALAZINE HCL 20 MG/ML IJ SOLN: 10.0000 mg | INTRAMUSCULAR | Status: DC | PRN

## 2022-06-07 MED ORDER — LIDOCAINE HCL (PF) 1 % IJ SOLN
INTRAMUSCULAR | Status: DC | PRN
  Administered 2022-06-07: 2 mL

## 2022-06-07 SURGICAL SUPPLY — 9 items
CATH 5FR JL3.5 JR4 ANG PIG MP (CATHETERS) IMPLANT
DEVICE RAD COMP TR BAND LRG (VASCULAR PRODUCTS) IMPLANT
GLIDESHEATH SLEND SS 6F .021 (SHEATH) IMPLANT
GUIDEWIRE INQWIRE 1.5J.035X260 (WIRE) IMPLANT
INQWIRE 1.5J .035X260CM (WIRE) ×1
KIT HEART LEFT (KITS) ×1 IMPLANT
PACK CARDIAC CATHETERIZATION (CUSTOM PROCEDURE TRAY) ×1 IMPLANT
TRANSDUCER W/STOPCOCK (MISCELLANEOUS) ×1 IMPLANT
TUBING CIL FLEX 10 FLL-RA (TUBING) ×1 IMPLANT

## 2022-06-07 NOTE — Telephone Encounter (Signed)
-----   Message from Kathleene Hazel, MD sent at 06/07/2022 10:25 AM EST ----- Hey. Non-obstructive CAD by cath. LDL 81 on crestor 40 mg daily. Can we add Zetia 10 mg daily, continue Crestor 40 mg daily and then repeat lipids in 12 weeks. If he is not at goal LDL of 55 then, can consider Repatha. Thanks, chris

## 2022-06-07 NOTE — Interval H&P Note (Signed)
History and Physical Interval Note: Cath Lab Visit (complete for each Cath Lab visit)  Clinical Evaluation Leading to the Procedure:   ACS: No.  Non-ACS:    Anginal Classification: CCS III  Anti-ischemic medical therapy: Minimal Therapy (1 class of medications)  Non-Invasive Test Results: High-risk stress test findings: cardiac mortality >3%/year (Coronary CTA with three vessel CAD with questionable flow limiting lesions)  Prior CABG: No previous CABG       7:31 AM  William Mcmillan  has presented today for surgery, with the diagnosis of cad - abnormal cta.  The various methods of treatment have been discussed with the patient and family. After consideration of risks, benefits and other options for treatment, the patient has consented to  Procedure(s): LEFT HEART CATH AND CORONARY ANGIOGRAPHY (N/A) as a surgical intervention.  The patient's history has been reviewed, patient examined, no change in status, stable for surgery.  I have reviewed the patient's chart and labs.  Questions were answered to the patient's satisfaction.      Verne Carrow

## 2022-06-22 NOTE — Progress Notes (Signed)
Cardiology Office Note:    Date:  06/25/2022   ID:  Mitzie Na, DOB April 10, 1961, MRN [id number]  PCP:  Lavone Orn, MD   [facility] Providers Cardiologist:  None Electrophysiologist:  Cristopher Peru, MD     Referring MD: Lavone Orn, MD   Chief Complaint: hyperlipidemia, CAD  History of Present Illness:    Sivan Quast is a very pleasant 62 y.o. male with a hx of CAD with moderate disease on cardiac cath 05/2022, HTN, hypothyroidism, HLD, family history CAD with both parents requiring CABG in their 57s. His grandfather died of an MI at age 58, and his brother had 2 MIs in his 66s.  Referred by Dr. Lovena Le for discussion regarding coronary artery disease. Has been followed by Dr. Lovena Le who is also his neighbor.  Recent abnormal coronary calcium score with coronary CTA that revealed at least moderate disease in all 3 coronary arteries.  He described progressive fatigue and dyspnea with moderate exertion over the last few months. He is also a runner. No chest pain with exertion. He underwent left heart catheterization on 06/07/2022 which revealed mild nonobstructive disease in the proximal RCA, obtuse marginal branch, proximal LAD and mid LAD.  Moderate nonobstructive disease in the small ramus intermediate branch (2.0 vessel), normal LV function.  Transaminases were elevated on statin therapy.  Ezetimibe 10 mg was added to current therapy of Crestor 40 mg. He is scheduled for repeat fasting lab work on March 20.   Today, he is here for follow-up of cardiac catheterization and is feeling well. He has resumed exercise and has had no chest pain, shortness of breath, fatigue, or palpitations. He has attributed symptoms as being age-related decline in exercise tolerance. Is relieved by cath results and interested in prevention of further CAD. He denies chest pain, dyspnea, orthopnea, PND, palpitations, presyncope, syncope. Cath results reviewed in detail and questions answered to his  satisfaction. Lengthy discussion about heart healthy, mostly plant-based diet, exercise, and the impact of cholesterol on coronary artery disease.  Past Medical History:  Diagnosis Date   Chest pain 11/24/2012   Dyslipidemia 11/24/2012   Essential hypertension 11/24/2012   Leg pain 11/24/2012   Thyroid disease    hypo    Past Surgical History:  Procedure Laterality Date   KNEE SURGERY     LEFT HEART CATH AND CORONARY ANGIOGRAPHY N/A 06/07/2022   Procedure: LEFT HEART CATH AND CORONARY ANGIOGRAPHY;  Surgeon: Burnell Blanks, MD;  Location: [place] [facility];  Service: Cardiovascular;  Laterality: N/A;    Current Medications: Current Meds  Medication Sig   amLODipine (NORVASC) 5 MG tablet Take 1 tablet (5 mg total) by mouth daily. USE AS DIRECTED   aspirin-acetaminophen-caffeine (EXCEDRIN MIGRAINE) 250-250-65 MG tablet Take 2 tablets by mouth every 6 (six) hours as needed for headache.   ezetimibe (ZETIA) 10 MG tablet Take 1 tablet (10 mg total) by mouth daily.   levothyroxine (SYNTHROID) 88 MCG tablet Take 88 mcg by mouth every morning.   liothyronine (CYTOMEL) 5 MCG tablet Take 10 mcg by mouth daily.    rosuvastatin (CRESTOR) 40 MG tablet Take 1 tablet (40 mg total) by mouth daily.   Current Facility-Administered Medications for the 06/25/22 encounter (Office Visit) with Emmaline Life, NP  Medication   sodium chloride flush (NS) 0.9 % injection 3 mL     Allergies:   Patient has no known allergies.   Social History   Socioeconomic History   Marital status: Married  Spouse name: Not on file   Number of children: 3   Years of education: Not on file   Highest education level: Not on file  Occupational History   Occupation: Works for family [employer]  Tobacco Use   Smoking status: Former    Packs/day: 0.50    Years: 2.00    Total pack years: 1.00    Types: Cigarettes    Quit date: 06/21/1980    Years since quitting: 42.0   Smokeless tobacco: Former     Types: Snuff    Quit date: 06/21/1980  Vaping Use   Vaping Use: Never used  Substance and Sexual Activity   Alcohol use: No    Comment: occassional   Drug use: No   Sexual activity: Not on file  Other Topics Concern   Not on file  Social History Narrative   Not on file   Social Determinants of Health   Financial Resource Strain: Not on file  Food Insecurity: Not on file  Transportation Needs: Not on file  Physical Activity: Not on file  Stress: Not on file  Social Connections: Not on file     Family History: The patient's family history includes Coronary artery disease in his brother; Heart attack (age of onset: 31) in his father; Heart disease in his father and mother.  ROS:   Please see the history of present illness.  All other systems reviewed and are negative.  Labs/Other Studies Reviewed:    The following studies were reviewed today:  LHC 06/07/22   Ost RCA to Prox RCA lesion is 20% stenosed.   Ramus lesion is 50% stenosed.   3rd Mrg lesion is 30% stenosed.   Prox LAD lesion is 40% stenosed.   Mid LAD lesion is 20% stenosed.   Mild non-obstructive disease in the proximal RCA, obtuse marginal branch, proximal LAD and mid LAD Moderate non-obstructive disease in the small ramus intermediate branch. (2.0 vessel).  Normal LV systolic function. LVEF 55-50%.  LVEDP 18 mmHg   Recommendations: Medical management of CAD. He is on a statin and ASA. LDL goal under 55.   CCTA 05/11/22 Coronary calcium score is 567, which places the patient in the 94th percentile for age and sex matched control.  1. Severe CAD in the proximal high branching OM1 and likely in the high branching first diagonal artery, 70-99% stenosis, CADRADS 4. CT FFR will be performed and reported separately.   2. Otherwise, moderate CAD in the proximal LAD, mid LCx, and distal RCA. There is vulnerable plaque characteristics in the proximal LAD: Low attenuation plaque and positive remodeling are  seen.   3. Coronary calcium score is 567, which places the patient in the 94th percentile for age and sex matched control.   4. Normal coronary origins with right dominance.  1. CT FFR analysis showed high likelihood of hemodynamically significant stenosis in the proximal OM1 lesion, FFR 0.73. Vessel is >2 mm distal to stenosis.   2.  Borderline FFR of proximal first diagonal artery lesion.  Recent Labs: 04/07/2022: ALT 38 05/24/2022: BUN 12; Creatinine, Ser 1.06; Hemoglobin 13.5; Platelets 192; Potassium 4.5; Sodium 144  Recent Lipid Panel    Component Value Date/Time   CHOL 154 04/07/2022 0721   TRIG 141 04/07/2022 0721   HDL 48 04/07/2022 0721   CHOLHDL 3.2 04/07/2022 0721   CHOLHDL 4 02/27/2015 0757   VLDL 27.8 02/27/2015 0757   LDLCALC 81 04/07/2022 0721   LDLDIRECT 141.5 09/22/2010 0858  Risk Assessment/Calculations:      Physical Exam:    VS:  BP 110/80   Pulse 70   Ht 5\' 9"  (1.753 m)   Wt 150 lb (68 kg)   SpO2 99%   BMI 22.15 kg/m     Wt Readings from Last 3 Encounters:  06/25/22 150 lb (68 kg)  06/07/22 150 lb (68 kg)  05/24/22 150 lb 3.2 oz (68.1 kg)     GEN:  Well nourished, well developed in no acute distress HEENT: Normal NECK: No JVD; No carotid bruits CARDIAC: RRR, no murmurs, rubs, gallops RESPIRATORY:  Clear to auscultation without rales, wheezing or rhonchi  ABDOMEN: Soft, non-tender, non-distended MUSCULOSKELETAL:  No edema; No deformity. 2+ pedal pulses, equal bilaterally SKIN: Warm and dry. Right radial cath site well healed, no concerns NEUROLOGIC:  Alert and oriented x 3 PSYCHIATRIC:  Normal affect   EKG:  EKG is not ordered today.    Diagnoses:    1. Coronary artery disease involving native coronary artery of native heart without angina pectoris   2. Hyperlipidemia LDL goal <70   3. Abnormal findings diagnostic imaging of heart and coronary circulation   4. Essential hypertension    Assessment and Plan:     CAD without  angina: Abnormal CTA 05/11/2022 [facility] that revealed mild nonobstructive disease in the proximal RCA, obtuse marginal branch, proximal LAD and mid LAD andmid LAD and moderate nonobstructive disease in the small ramus intermediate branch (2.0 vessel), recommendation medical management. Normal LVEF and LVEDP. Lengthy discussion about CV risk prevention with healthy diet, regular exercise, good cholesterol and BP control. He is very active and exercise regularly. Very motivated to prevent further disease burden.   Hyperlipidemia LDL goal < 55: LDL 81 on 04/07/22. Previous LDL in the 60s.  At time of blood test on 10/18 he had recently returned from 18 [place] and wonders if diet of freeze-dried foods impacted the results.  He is tolerating Crestor 40 mg and Zetia 10 mg daily without problem. Will have repeat lab tests in 2 months.  Lengthy discussion about heart healthy, mostly plant-based diet. He is in favor of lipid clinic referral with Pharm D if LDL remains above goal of < 55.   Hypertension: BP is well controlled.      Disposition: 1 year with Dr. Ladona Ridgelaylor   Medication Adjustments/Labs and Tests Ordered: Current medicines are reviewed at length with the patient today.  Concerns regarding medicines are outlined above.  No orders of the defined types were placed in this encounter.  No orders of the defined types were placed in this encounter.   Patient Instructions  Medication Instructions:   Your physician recommends that you continue on your current medications as directed. Please refer to the Current Medication list given to you today.   *If you need a refill on your cardiac medications before your next appointment, please call your pharmacy*   Lab Work:  Keep upcoming lab appointment.   If you have labs (blood work) drawn today and your tests are completely normal, you will receive your results only by: MyChart Message (if you have  MyChart) OR A paper copy in the mail If you have any lab test that is abnormal or we need to change your treatment, we will call you to review the results.   Testing/Procedures:  None ordered.   Follow-Up: At [facility], you and your health needs are our priority.  As part of our continuing mission to provide you with exceptional heart care, we have created designated Provider Care Teams.  These Care Teams include your primary Cardiologist (physician) and Advanced Practice Providers (APPs -  Physician Assistants and Nurse Practitioners) who all work together to provide you with the care you need, when you need it.  We recommend signing up for the patient portal called "MyChart".  Sign up information is provided on this After Visit Summary.  MyChart is used to connect with patients for Virtual Visits (Telemedicine).  Patients are able to view lab/test results, encounter notes, upcoming appointments, etc.  Non-urgent messages can be sent to your provider as well.   To learn more about what you can do with MyChart, go to [email or web address].    Your next appointment:   1 year(s)  The format for your next appointment:   In Person  Provider:   Sharrell Ku, MD    Other Instructions  Adopting a Healthy Lifestyle.   Weight: Know what a healthy weight is for you (roughly BMI <25) and aim to maintain this. You can calculate your body mass index on your smart phone  Diet: Aim for 7+ servings of fruits and vegetables daily Limit animal fats in diet for cholesterol and heart health - choose grass fed whenever available Avoid highly processed foods (fast food burgers, tacos, fried chicken, pizza, hot dogs, french fries)  Saturated fat comes in the form of butter, lard, coconut oil, margarine, partially hydrogenated oils, and fat in meat. These increase your risk of cardiovascular disease.  Use healthy plant oils, such as olive, canola, soy, corn, sunflower and peanut.  Whole  foods such as fruits, vegetables and whole grains have fiber  Men need > 38 grams of fiber per day Women need > 25 grams of fiber per day  Load up on vegetables and fruits - one-half of your plate: Aim for color and variety, and remember that potatoes dont count. Go for whole grains - one-quarter of your plate: Whole wheat, barley, wheat berries, quinoa, oats, brown rice, and foods made with them. If you want pasta, go with whole wheat pasta. Protein power - one-quarter of your plate: Fish, chicken, beans, and nuts are all healthy, versatile protein sources. Limit red meat. You need carbohydrates for energy! The type of carbohydrate is more important than the amount. Choose carbohydrates such as vegetables, fruits, whole grains, beans, and nuts in the place of white rice, white pasta, potatoes (baked or fried), macaroni and cheese, cakes, cookies, and donuts.  If youre thirsty, drink water. Coffee and tea are good in moderation, but skip sugary drinks and limit milk and dairy products to one or two daily servings. Keep sugar intake at 6 teaspoons or 24 grams or LESS       Exercise: Aim for 150 min of moderate intensity exercise weekly for heart health, and weights twice weekly for bone health Stay active - any steps are better than no steps! Aim for 7-9 hours of sleep daily      Mediterranean Diet A Mediterranean diet refers to food and lifestyle choices that are based on the traditions of countries located on the [employer]. It focuses on eating more fruits, vegetables, whole grains, beans, nuts, seeds, and heart-healthy fats, and eating less dairy, meat, eggs, and processed foods with added sugar, salt, and fat. This way of eating has been shown to help prevent certain conditions and improve outcomes for people who have chronic diseases, like kidney disease  and heart disease. What are tips for following this plan? Reading food labels Check the serving size of packaged foods. For  foods such as rice and pasta, the serving size refers to the amount of cooked product, not dry. Check the total fat in packaged foods. Avoid foods that have saturated fat or trans fats. Check the ingredient list for added sugars, such as corn syrup. Shopping  Buy a variety of foods that offer a balanced diet, including: Fresh fruits and vegetables (produce). Grains, beans, nuts, and seeds. Some of these may be available in unpackaged forms or large amounts (in bulk). Fresh seafood. Poultry and eggs. Low-fat dairy products. Buy whole ingredients instead of prepackaged foods. Buy fresh fruits and vegetables in-season from local farmers markets. Buy plain frozen fruits and vegetables. If you do not have access to quality fresh seafood, buy precooked frozen shrimp or canned fish, such as tuna, salmon, or sardines. Stock your pantry so you always have certain foods on hand, such as olive oil, canned tuna, canned tomatoes, rice, pasta, and beans. Cooking Cook foods with extra-virgin olive oil instead of using butter or other vegetable oils. Have meat as a side dish, and have vegetables or grains as your main dish. This means having meat in small portions or adding small amounts of meat to foods like pasta or stew. Use beans or vegetables instead of meat in common dishes like chili or lasagna. Experiment with different cooking methods. Try roasting, broiling, steaming, and sauting vegetables. Add frozen vegetables to soups, stews, pasta, or rice. Add nuts or seeds for added healthy fats and plant protein at each meal. You can add these to yogurt, salads, or vegetable dishes. Marinate fish or vegetables using olive oil, lemon juice, garlic, and fresh herbs. Meal planning Plan to eat one vegetarian meal one day each week. Try to work up to two vegetarian meals, if possible. Eat seafood two or more times a week. Have healthy snacks readily available, such as: Vegetable sticks with hummus. Greek  yogurt. Fruit and nut trail mix. Eat balanced meals throughout the week. This includes: Fruit: 2-3 servings a day. Vegetables: 4-5 servings a day. Low-fat dairy: 2 servings a day. Fish, poultry, or lean meat: 1 serving a day. Beans and legumes: 2 or more servings a week. Nuts and seeds: 1-2 servings a day. Whole grains: 6-8 servings a day. Extra-virgin olive oil: 3-4 servings a day. Limit red meat and sweets to only a few servings a month. Lifestyle  Cook and eat meals together with your family, when possible. Drink enough fluid to keep your urine pale yellow. Be physically active every day. This includes: Aerobic exercise like running or swimming. Leisure activities like gardening, walking, or housework. Get 7-8 hours of sleep each night. If recommended by your health care provider, drink red wine in moderation. This means 1 glass a day for nonpregnant women and 2 glasses a day for men. A glass of wine equals 5 oz (150 mL). What foods should I eat? Fruits Apples. Apricots. Avocado. Berries. Bananas. Cherries. Dates. Figs. Grapes. Lemons. Melon. Oranges. Peaches. Plums. Pomegranate. Vegetables Artichokes. Beets. Broccoli. Cabbage. Carrots. Eggplant. Green beans. Chard. Kale. Spinach. Onions. Leeks. Peas. Squash. Tomatoes. Peppers. Radishes. Grains Whole-grain pasta. Brown rice. Bulgur wheat. Polenta. Couscous. Whole-wheat bread. [place]. Meats and other proteins Beans. Almonds. Sunflower seeds. Pine nuts. Peanuts. [place]. Salmon. Scallops. Shrimp. [place]. Tilapia. Clams. Oysters. Eggs. Poultry without skin. Dairy Low-fat milk. Cheese. Greek yogurt. Fats and oils Extra-virgin olive oil. Avocado oil.  Grapeseed oil. Beverages Water. Red wine. Herbal tea. Sweets and desserts Greek yogurt with honey. Baked apples. Poached pears. Trail mix. Seasonings and condiments Basil. Cilantro. Coriander. Cumin. Mint. Parsley. Sage. Rosemary. Tarragon. Garlic. Oregano. Thyme. Pepper. Balsamic  vinegar. Tahini. Hummus. Tomato sauce. Olives. Mushrooms. The items listed above may not be a complete list of foods and beverages you can eat. Contact a dietitian for more information. What foods should I limit? This is a list of foods that should be eaten rarely or only on special occasions. Fruits Fruit canned in syrup. Vegetables Deep-fried potatoes (french fries). Grains Prepackaged pasta or rice dishes. Prepackaged cereal with added sugar. Prepackaged snacks with added sugar. Meats and other proteins Beef. Pork. Lamb. Poultry with skin. Hot dogs. [place] Blase. Dairy Ice cream. Sour cream. Whole milk. Fats and oils Butter. Canola oil. Vegetable oil. Beef fat (tallow). Lard. Beverages Juice. Sugar-sweetened soft drinks. Beer. Liquor and spirits. Sweets and desserts Cookies. Cakes. Pies. Candy. Seasonings and condiments Mayonnaise. Pre-made sauces and marinades. The items listed above may not be a complete list of foods and beverages you should limit. Contact a dietitian for more information. Summary The Mediterranean diet includes both food and lifestyle choices. Eat a variety of fresh fruits and vegetables, beans, nuts, seeds, and whole grains. Limit the amount of red meat and sweets that you eat. If recommended by your health care provider, drink red wine in moderation. This means 1 glass a day for nonpregnant women and 2 glasses a day for men. A glass of wine equals 5 oz (150 mL). This information is not intended to replace advice given to you by your health care provider. Make sure you discuss any questions you have with your health care provider. Document Revised: 07/13/2019 Document Reviewed: 05/10/2019 Elsevier Patient Education  2023 Elsevier Inc.   Important Information About Sugar         Signed, Levi Aland, NP  06/25/2022 8:35 AM    [facility]

## 2022-06-25 ENCOUNTER — Encounter: Payer: Self-pay | Admitting: Nurse Practitioner

## 2022-06-25 ENCOUNTER — Ambulatory Visit: Payer: BC Managed Care – PPO | Attending: Nurse Practitioner | Admitting: Nurse Practitioner

## 2022-06-25 VITALS — BP 110/80 | HR 70 | Ht 69.0 in | Wt 150.0 lb

## 2022-06-25 DIAGNOSIS — E785 Hyperlipidemia, unspecified: Secondary | ICD-10-CM

## 2022-06-25 DIAGNOSIS — I2510 Atherosclerotic heart disease of native coronary artery without angina pectoris: Secondary | ICD-10-CM

## 2022-06-25 DIAGNOSIS — I10 Essential (primary) hypertension: Secondary | ICD-10-CM

## 2022-06-25 DIAGNOSIS — R931 Abnormal findings on diagnostic imaging of heart and coronary circulation: Secondary | ICD-10-CM | POA: Diagnosis not present

## 2022-06-25 NOTE — Patient Instructions (Signed)
Medication Instructions:   Your physician recommends that you continue on your current medications as directed. Please refer to the Current Medication list given to you today.   *If you need a refill on your cardiac medications before your next appointment, please call your pharmacy*   Lab Work:  Keep upcoming lab appointment.   If you have labs (blood work) drawn today and your tests are completely normal, you will receive your results only by: [place] (if you have MyChart) OR A paper copy in the mail If you have any lab test that is abnormal or we need to change your treatment, we will call you to review the results.   Testing/Procedures:  None ordered.   Follow-Up: At [facility] you and your health needs are our priority.  As part of our continuing mission to provide you with exceptional heart care, we have created designated Provider Care Teams.  These Care Teams include your primary Cardiologist (physician) and Advanced Practice Providers (APPs -  Physician Assistants and Nurse Practitioners) who all work together to provide you with the care you need, when you need it.  We recommend signing up for the patient portal called "MyChart".  Sign up information is provided on this After Visit Summary.  MyChart is used to connect with patients for Virtual Visits (Telemedicine).  Patients are able to view lab/test results, encounter notes, upcoming appointments, etc.  Non-urgent messages can be sent to your provider as well.   To learn more about what you can do with MyChart, go to [email or web address].    Your next appointment:   1 year(s)  The format for your next appointment:   In Person  Provider:   Crissie Sickles, MD    Other Instructions  Adopting a Healthy Lifestyle.   Weight: Know what a healthy weight is for you (roughly BMI <25) and aim to maintain this. You can calculate your body mass index on your smart phone  Diet: Aim for 7+ servings of  fruits and vegetables daily Limit animal fats in diet for cholesterol and heart health - choose grass fed whenever available Avoid highly processed foods (fast food burgers, tacos, fried chicken, pizza, hot dogs, french fries)  Saturated fat comes in the form of butter, lard, coconut oil, margarine, partially hydrogenated oils, and fat in meat. These increase your risk of cardiovascular disease.  Use healthy plant oils, such as olive, canola, soy, corn, sunflower and peanut.  Whole foods such as fruits, vegetables and whole grains have fiber  Men need > 38 grams of fiber per day Women need > 25 grams of fiber per day  Load up on vegetables and fruits - one-half of your plate: Aim for color and variety, and remember that potatoes dont count. Go for whole grains - one-quarter of your plate: Whole wheat, barley, wheat berries, quinoa, oats, brown rice, and foods made with them. If you want pasta, go with whole wheat pasta. Protein power - one-quarter of your plate: Fish, chicken, beans, and nuts are all healthy, versatile protein sources. Limit red meat. You need carbohydrates for energy! The type of carbohydrate is more important than the amount. Choose carbohydrates such as vegetables, fruits, whole grains, beans, and nuts in the place of white rice, white pasta, potatoes (baked or fried), macaroni and cheese, cakes, cookies, and donuts.  If youre thirsty, drink water. Coffee and tea are good in moderation, but skip sugary drinks and limit milk and dairy products to one or two daily servings.  Keep sugar intake at 6 teaspoons or 24 grams or LESS       Exercise: Aim for 150 min of moderate intensity exercise weekly for heart health, and weights twice weekly for bone health Stay active - any steps are better than no steps! Aim for 7-9 hours of sleep daily      Mediterranean Diet A Mediterranean diet refers to food and lifestyle choices that are based on the traditions of countries located on  the [employer]. It focuses on eating more fruits, vegetables, whole grains, beans, nuts, seeds, and heart-healthy fats, and eating less dairy, meat, eggs, and processed foods with added sugar, salt, and fat. This way of eating has been shown to help prevent certain conditions and improve outcomes for people who have chronic diseases, like kidney disease and heart disease. What are tips for following this plan? Reading food labels Check the serving size of packaged foods. For foods such as rice and pasta, the serving size refers to the amount of cooked product, not dry. Check the total fat in packaged foods. Avoid foods that have saturated fat or trans fats. Check the ingredient list for added sugars, such as corn syrup. Shopping  Buy a variety of foods that offer a balanced diet, including: Fresh fruits and vegetables (produce). Grains, beans, nuts, and seeds. Some of these may be available in unpackaged forms or large amounts (in bulk). Fresh seafood. Poultry and eggs. Low-fat dairy products. Buy whole ingredients instead of prepackaged foods. Buy fresh fruits and vegetables in-season from local farmers markets. Buy plain frozen fruits and vegetables. If you do not have access to quality fresh seafood, buy precooked frozen shrimp or canned fish, such as tuna, salmon, or sardines. Stock your pantry so you always have certain foods on hand, such as olive oil, canned tuna, canned tomatoes, rice, pasta, and beans. Cooking Cook foods with extra-virgin olive oil instead of using butter or other vegetable oils. Have meat as a side dish, and have vegetables or grains as your main dish. This means having meat in small portions or adding small amounts of meat to foods like pasta or stew. Use beans or vegetables instead of meat in common dishes like chili or lasagna. Experiment with different cooking methods. Try roasting, broiling, steaming, and sauting vegetables. Add frozen vegetables to  soups, stews, pasta, or rice. Add nuts or seeds for added healthy fats and plant protein at each meal. You can add these to yogurt, salads, or vegetable dishes. Marinate fish or vegetables using olive oil, lemon juice, garlic, and fresh herbs. Meal planning Plan to eat one vegetarian meal one day each week. Try to work up to two vegetarian meals, if possible. Eat seafood two or more times a week. Have healthy snacks readily available, such as: Vegetable sticks with hummus. Greek yogurt. Fruit and nut trail mix. Eat balanced meals throughout the week. This includes: Fruit: 2-3 servings a day. Vegetables: 4-5 servings a day. Low-fat dairy: 2 servings a day. Fish, poultry, or lean meat: 1 serving a day. Beans and legumes: 2 or more servings a week. Nuts and seeds: 1-2 servings a day. Whole grains: 6-8 servings a day. Extra-virgin olive oil: 3-4 servings a day. Limit red meat and sweets to only a few servings a month. Lifestyle  Cook and eat meals together with your family, when possible. Drink enough fluid to keep your urine pale yellow. Be physically active every day. This includes: Aerobic exercise like running or swimming. Leisure activities like gardening,  walking, or housework. Get 7-8 hours of sleep each night. If recommended by your health care provider, drink red wine in moderation. This means 1 glass a day for nonpregnant women and 2 glasses a day for men. A glass of wine equals 5 oz (150 mL). What foods should I eat? Fruits Apples. Apricots. Avocado. Berries. Bananas. Cherries. Dates. Figs. Grapes. Lemons. Melon. Oranges. Peaches. Plums. Pomegranate. Vegetables Artichokes. Beets. Broccoli. Cabbage. Carrots. Eggplant. Green beans. Chard. Kale. Spinach. Onions. Leeks. Peas. Squash. Tomatoes. Peppers. Radishes. Grains Whole-grain pasta. Brown rice. Bulgur wheat. Polenta. Couscous. Whole-wheat bread. Orpah Cobb. Meats and other proteins Beans. Almonds. Sunflower seeds.  Pine nuts. Peanuts. Cod. Salmon. Scallops. Shrimp. Tuna. Tilapia. Clams. Oysters. Eggs. Poultry without skin. Dairy Low-fat milk. Cheese. Greek yogurt. Fats and oils Extra-virgin olive oil. Avocado oil. Grapeseed oil. Beverages Water. Red wine. Herbal tea. Sweets and desserts Greek yogurt with honey. Baked apples. Poached pears. Trail mix. Seasonings and condiments Basil. Cilantro. Coriander. Cumin. Mint. Parsley. Sage. Rosemary. Tarragon. Garlic. Oregano. Thyme. Pepper. Balsamic vinegar. Tahini. Hummus. Tomato sauce. Olives. Mushrooms. The items listed above may not be a complete list of foods and beverages you can eat. Contact a dietitian for more information. What foods should I limit? This is a list of foods that should be eaten rarely or only on special occasions. Fruits Fruit canned in syrup. Vegetables Deep-fried potatoes (french fries). Grains Prepackaged pasta or rice dishes. Prepackaged cereal with added sugar. Prepackaged snacks with added sugar. Meats and other proteins Beef. Pork. Lamb. Poultry with skin. Hot dogs. Tomasa Blase. Dairy Ice cream. Sour cream. Whole milk. Fats and oils Butter. Canola oil. Vegetable oil. Beef fat (tallow). Lard. Beverages Juice. Sugar-sweetened soft drinks. Beer. Liquor and spirits. Sweets and desserts Cookies. Cakes. Pies. Candy. Seasonings and condiments Mayonnaise. Pre-made sauces and marinades. The items listed above may not be a complete list of foods and beverages you should limit. Contact a dietitian for more information. Summary The Mediterranean diet includes both food and lifestyle choices. Eat a variety of fresh fruits and vegetables, beans, nuts, seeds, and whole grains. Limit the amount of red meat and sweets that you eat. If recommended by your health care provider, drink red wine in moderation. This means 1 glass a day for nonpregnant women and 2 glasses a day for men. A glass of wine equals 5 oz (150 mL). This information is  not intended to replace advice given to you by your health care provider. Make sure you discuss any questions you have with your health care provider. Document Revised: 07/13/2019 Document Reviewed: 05/10/2019 Elsevier Patient Education  2023 Elsevier Inc.   Important Information About Sugar

## 2022-06-30 DIAGNOSIS — M9901 Segmental and somatic dysfunction of cervical region: Secondary | ICD-10-CM | POA: Diagnosis not present

## 2022-06-30 DIAGNOSIS — M609 Myositis, unspecified: Secondary | ICD-10-CM | POA: Diagnosis not present

## 2022-06-30 DIAGNOSIS — M47812 Spondylosis without myelopathy or radiculopathy, cervical region: Secondary | ICD-10-CM | POA: Diagnosis not present

## 2022-06-30 DIAGNOSIS — G44209 Tension-type headache, unspecified, not intractable: Secondary | ICD-10-CM | POA: Diagnosis not present

## 2022-07-06 DIAGNOSIS — M9901 Segmental and somatic dysfunction of cervical region: Secondary | ICD-10-CM | POA: Diagnosis not present

## 2022-07-06 DIAGNOSIS — M47812 Spondylosis without myelopathy or radiculopathy, cervical region: Secondary | ICD-10-CM | POA: Diagnosis not present

## 2022-07-06 DIAGNOSIS — G44209 Tension-type headache, unspecified, not intractable: Secondary | ICD-10-CM | POA: Diagnosis not present

## 2022-07-06 DIAGNOSIS — M609 Myositis, unspecified: Secondary | ICD-10-CM | POA: Diagnosis not present

## 2022-07-19 DIAGNOSIS — M609 Myositis, unspecified: Secondary | ICD-10-CM | POA: Diagnosis not present

## 2022-07-19 DIAGNOSIS — M47812 Spondylosis without myelopathy or radiculopathy, cervical region: Secondary | ICD-10-CM | POA: Diagnosis not present

## 2022-07-19 DIAGNOSIS — M9901 Segmental and somatic dysfunction of cervical region: Secondary | ICD-10-CM | POA: Diagnosis not present

## 2022-07-19 DIAGNOSIS — G44209 Tension-type headache, unspecified, not intractable: Secondary | ICD-10-CM | POA: Diagnosis not present

## 2022-08-17 DIAGNOSIS — L218 Other seborrheic dermatitis: Secondary | ICD-10-CM | POA: Diagnosis not present

## 2022-08-17 DIAGNOSIS — L812 Freckles: Secondary | ICD-10-CM | POA: Diagnosis not present

## 2022-08-17 DIAGNOSIS — L821 Other seborrheic keratosis: Secondary | ICD-10-CM | POA: Diagnosis not present

## 2022-08-17 DIAGNOSIS — D2272 Melanocytic nevi of left lower limb, including hip: Secondary | ICD-10-CM | POA: Diagnosis not present

## 2022-08-17 DIAGNOSIS — L570 Actinic keratosis: Secondary | ICD-10-CM | POA: Diagnosis not present

## 2022-08-19 DIAGNOSIS — Z0000 Encounter for general adult medical examination without abnormal findings: Secondary | ICD-10-CM | POA: Diagnosis not present

## 2022-08-19 DIAGNOSIS — Z125 Encounter for screening for malignant neoplasm of prostate: Secondary | ICD-10-CM | POA: Diagnosis not present

## 2022-08-19 DIAGNOSIS — E7800 Pure hypercholesterolemia, unspecified: Secondary | ICD-10-CM | POA: Diagnosis not present

## 2022-09-08 ENCOUNTER — Ambulatory Visit: Payer: BC Managed Care – PPO

## 2022-09-14 ENCOUNTER — Ambulatory Visit: Payer: BC Managed Care – PPO

## 2022-10-13 ENCOUNTER — Other Ambulatory Visit: Payer: Self-pay | Admitting: Internal Medicine

## 2022-10-13 ENCOUNTER — Ambulatory Visit: Payer: BC Managed Care – PPO | Attending: Cardiovascular Disease

## 2022-10-13 DIAGNOSIS — E785 Hyperlipidemia, unspecified: Secondary | ICD-10-CM | POA: Diagnosis not present

## 2022-10-13 DIAGNOSIS — I25119 Atherosclerotic heart disease of native coronary artery with unspecified angina pectoris: Secondary | ICD-10-CM | POA: Diagnosis not present

## 2022-10-14 LAB — LIPID PANEL
Chol/HDL Ratio: 2.2 ratio (ref 0.0–5.0)
Cholesterol, Total: 95 mg/dL — ABNORMAL LOW (ref 100–199)
HDL: 43 mg/dL (ref >39)
LDL Chol Calc (NIH): 35 mg/dL (ref 0–99)
Triglycerides: 85 mg/dL (ref 0–149)
VLDL Cholesterol Cal: 17 mg/dL (ref 5–40)

## 2023-01-10 ENCOUNTER — Other Ambulatory Visit: Payer: Self-pay | Admitting: Internal Medicine

## 2023-01-11 ENCOUNTER — Other Ambulatory Visit: Payer: Self-pay

## 2023-01-11 DIAGNOSIS — E785 Hyperlipidemia, unspecified: Secondary | ICD-10-CM

## 2023-01-11 DIAGNOSIS — I25119 Atherosclerotic heart disease of native coronary artery with unspecified angina pectoris: Secondary | ICD-10-CM

## 2023-01-11 MED ORDER — EZETIMIBE 10 MG PO TABS: 10.0000 mg | ORAL_TABLET | Freq: Every day | ORAL | 1 refills | Status: DC

## 2023-07-06 ENCOUNTER — Other Ambulatory Visit: Payer: Self-pay | Admitting: Internal Medicine

## 2023-08-02 ENCOUNTER — Other Ambulatory Visit: Payer: Self-pay | Admitting: Internal Medicine

## 2023-08-17 ENCOUNTER — Other Ambulatory Visit: Payer: Self-pay | Admitting: Internal Medicine

## 2023-08-23 DIAGNOSIS — Z0000 Encounter for general adult medical examination without abnormal findings: Secondary | ICD-10-CM | POA: Diagnosis not present

## 2023-08-23 DIAGNOSIS — E039 Hypothyroidism, unspecified: Secondary | ICD-10-CM | POA: Diagnosis not present

## 2023-08-23 DIAGNOSIS — Z5181 Encounter for therapeutic drug level monitoring: Secondary | ICD-10-CM | POA: Diagnosis not present

## 2023-08-23 DIAGNOSIS — Z125 Encounter for screening for malignant neoplasm of prostate: Secondary | ICD-10-CM | POA: Diagnosis not present

## 2023-08-23 DIAGNOSIS — I10 Essential (primary) hypertension: Secondary | ICD-10-CM | POA: Diagnosis not present

## 2023-08-23 DIAGNOSIS — E7800 Pure hypercholesterolemia, unspecified: Secondary | ICD-10-CM | POA: Diagnosis not present

## 2023-09-03 ENCOUNTER — Other Ambulatory Visit: Payer: Self-pay | Admitting: Internal Medicine

## 2023-09-08 ENCOUNTER — Other Ambulatory Visit: Payer: Self-pay | Admitting: Internal Medicine

## 2023-09-14 DIAGNOSIS — L821 Other seborrheic keratosis: Secondary | ICD-10-CM | POA: Diagnosis not present

## 2023-09-14 DIAGNOSIS — L812 Freckles: Secondary | ICD-10-CM | POA: Diagnosis not present

## 2023-09-14 DIAGNOSIS — L578 Other skin changes due to chronic exposure to nonionizing radiation: Secondary | ICD-10-CM | POA: Diagnosis not present

## 2023-09-19 DIAGNOSIS — F4322 Adjustment disorder with anxiety: Secondary | ICD-10-CM | POA: Diagnosis not present

## 2023-09-30 ENCOUNTER — Other Ambulatory Visit: Payer: Self-pay | Admitting: Nurse Practitioner

## 2023-09-30 DIAGNOSIS — E785 Hyperlipidemia, unspecified: Secondary | ICD-10-CM

## 2023-09-30 DIAGNOSIS — I25119 Atherosclerotic heart disease of native coronary artery with unspecified angina pectoris: Secondary | ICD-10-CM

## 2023-10-20 ENCOUNTER — Telehealth: Payer: Self-pay | Admitting: Internal Medicine

## 2023-10-20 MED ORDER — AMLODIPINE BESYLATE 5 MG PO TABS: 5.0000 mg | ORAL_TABLET | Freq: Every day | ORAL | 1 refills | Status: DC

## 2023-10-20 NOTE — Telephone Encounter (Signed)
*  STAT* If patient is at the pharmacy, call can be transferred to refill team.   1. Which medications need to be refilled? (please list name of each medication and dose if known)   amLODipine  (NORVASC ) 5 MG tablet   2. Would you like to learn more about the convenience, safety, & potential cost savings by using the [facility]?   3. Are you open to using the [facility] (Type [facility]. ).  4. Which pharmacy/location (including street and city if local pharmacy) is medication to be sent to?  [id number] - [place], [place] - [address]   5. Do they need a 30 day or 90 day supply?   90 day  Patient stated he is completely out of this medication.  Patient has appointment scheduled with Dr. Carolynne Citron on 7/11.

## 2023-10-20 NOTE — Telephone Encounter (Signed)
 Pt's medication was sent to pt's pharmacy as requested. Confirmation received.

## 2023-10-26 ENCOUNTER — Telehealth: Payer: Self-pay

## 2023-10-26 DIAGNOSIS — Z01818 Encounter for other preprocedural examination: Secondary | ICD-10-CM | POA: Diagnosis not present

## 2023-10-26 NOTE — Telephone Encounter (Signed)
   Name: William Mcmillan  DOB: 08/20/1960  MRN: [id number]  Primary Cardiologist: None Last OV: 06/25/22   Chart reviewed as part of pre-operative protocol coverage. Because of Isiaah Armentrout's past medical history and time since last visit, he will require a follow-up in-office visit in order to better assess preoperative cardiovascular risk.  Pre-op covering staff: - Please schedule appointment and call patient to inform them. If patient already had an upcoming appointment within acceptable timeframe, please add "pre-op clearance" to the appointment notes so provider is aware. - Please contact requesting surgeon's office via preferred method (i.e, phone, fax) to inform them of need for appointment prior to surgery.  Aireona Torelli D Jemuel Laursen, NP  10/26/2023, 10:40 AM

## 2023-10-26 NOTE — Telephone Encounter (Signed)
   Pre-operative Risk Assessment    Patient Name: William Mcmillan  DOB: [birth year] MRN: [id number]   Date of last office visit: 06/25/22 Slater Duncan, NP Date of next office visit: 12/30/23 Manya Sells, MD   Request for Surgical Clearance    Procedure:   COLONOSCOPY  Date of Surgery:  Clearance 11/10/23                                Surgeon:  DR Lanita Pitman Surgeon's Group or Practice Name:  [facility] Phone number:  [phone] Fax number:  [phone]   Type of Clearance Requested:   - Medical    Type of Anesthesia:   PROPOFOL   Additional requests/questions:    Signed, Collin Deal   10/26/2023, 10:15 AM

## 2023-10-26 NOTE — Telephone Encounter (Signed)
 Pt has been scheduled in office appt with [name], NP 11/08/23 at [facility] @ 8:50. Pt is going to be out of town multiple times during May. Pt is good with the appt 11/08/23 as he has no medications needing to be held.   I will update all parties.

## 2023-11-08 ENCOUNTER — Ambulatory Visit (HOSPITAL_BASED_OUTPATIENT_CLINIC_OR_DEPARTMENT_OTHER): Admitting: Family

## 2023-11-08 ENCOUNTER — Encounter (HOSPITAL_BASED_OUTPATIENT_CLINIC_OR_DEPARTMENT_OTHER): Payer: Self-pay | Admitting: Family

## 2023-11-08 VITALS — BP 110/58 | HR 52 | Ht 69.0 in | Wt 152.4 lb

## 2023-11-08 DIAGNOSIS — E785 Hyperlipidemia, unspecified: Secondary | ICD-10-CM | POA: Diagnosis not present

## 2023-11-08 DIAGNOSIS — Z01818 Encounter for other preprocedural examination: Secondary | ICD-10-CM

## 2023-11-08 DIAGNOSIS — I10 Essential (primary) hypertension: Secondary | ICD-10-CM

## 2023-11-08 DIAGNOSIS — I25119 Atherosclerotic heart disease of native coronary artery with unspecified angina pectoris: Secondary | ICD-10-CM | POA: Diagnosis not present

## 2023-11-08 MED ORDER — EZETIMIBE 10 MG PO TABS: 10.0000 mg | ORAL_TABLET | Freq: Every day | ORAL | 3 refills | Status: AC

## 2023-11-08 MED ORDER — AMLODIPINE BESYLATE 5 MG PO TABS: 5.0000 mg | ORAL_TABLET | Freq: Every day | ORAL | 3 refills | Status: AC

## 2023-11-08 NOTE — Patient Instructions (Addendum)
 Medication Instructions:  Continue your current medications.   *If you need a refill on your cardiac medications before your next appointment, please call your pharmacy*  Lab Work: Your cholesterol in March looked great!  Testing/Procedures: Your EKG today looked great.  Follow-Up: Your next appointment:   1 year(s) with Dr. Carolynne Citron   Other Instructions   Heart Healthy Diet Recommendations: A low-salt diet is recommended. Meats should be grilled, baked, or boiled. Avoid fried foods. Focus on lean protein sources like fish or chicken with vegetables and fruits. The American Heart Association is a [employer]!  American Heart Association Diet and Lifeystyle Recommendations    Exercise recommendations: The American Heart Association recommends 150 minutes of moderate intensity exercise weekly. Try 30 minutes of moderate intensity exercise 4-5 times per week. This could include walking, jogging, or swimming.

## 2023-11-08 NOTE — Progress Notes (Signed)
 Cardiology Office Note:  .   Date:  11/08/2023  ID:  Mcmillan William, DOB Nov 11, 1960, MRN [id number] PCP: Lysle Saunas, MD (Inactive)  [facility] Providers Cardiologist:  None Electrophysiologist:  Manya Sells, MD    History of Present Illness: .   William Mcmillan is a 63 y.o. male with hx of CAD (LHC 04/2022 with nonobstructive CAD), HTN, hypothyroidism, HLD. Family history of CAD (both parents with CABG in their 45s, grandfather with fatal MI at 18, and brother with 2 MIs in his 72s).   Presents today for preoperative clearance for colonoscopy. Reports feeling overall well since last seen. Reports no shortness of breath nor dyspnea on exertion. Reports no chest pain, pressure, or tightness. No edema, orthopnea, PND. Reports no palpitations.  Exercising regularly through hiking, running. Endorses eating heart healthy diet.   ROS: Please see the history of present illness.    All other systems reviewed and are negative.   Studies Reviewed: William Mcmillan   EKG Interpretation Date/Time:  Tuesday Nov 08 2023 08:47:12 EDT Ventricular Rate:  52 PR Interval:  166 QRS Duration:  96 QT Interval:  432 QTC Calculation: 401 R Axis:   81  Text Interpretation: Sinus bradycardia No acute ST/T wave changes. Confirmed by William Mcmillan ([id number]) on 11/08/2023 8:57:12 AM    Cardiac Studies & Procedures   ______________________________________________________________________________________________ CARDIAC CATHETERIZATION  CARDIAC CATHETERIZATION 06/07/2022  Conclusion   Ost RCA to Prox RCA lesion is 20% stenosed.   Ramus lesion is 50% stenosed.   3rd Mrg lesion is 30% stenosed.   Prox LAD lesion is 40% stenosed.   Mid LAD lesion is 20% stenosed.  Mild non-obstructive disease in the proximal RCA, obtuse marginal branch, proximal LAD and mid LAD Moderate non-obstructive disease in the small ramus intermediate branch. (2.0 vessel). Normal LV systolic function. LVEF 55-50%. LVEDP 18  mmHg  Recommendations: Medical management of CAD. He is on a statin and ASA. LDL goal under 55.  Findings Coronary Findings Diagnostic  Dominance: Right  Left Anterior Descending Vessel is large. Prox LAD lesion is 40% stenosed. Mid LAD lesion is 20% stenosed.  Ramus Intermedius Vessel is small. Ramus lesion is 50% stenosed.  Left Circumflex Vessel is large.  Third Obtuse Marginal Branch 3rd Mrg lesion is 30% stenosed.  Right Coronary Artery Vessel is large. Ost RCA to Prox RCA lesion is 20% stenosed.  Intervention  No interventions have been documented.          CT SCANS  CT CORONARY MORPH W/CTA COR W/SCORE 05/11/2022  Addendum 05/11/2022  4:54 PM ADDENDUM REPORT: 05/11/2022 16:52  EXAM: OVER-READ INTERPRETATION  CT CHEST  The following report is an over-read performed by radiologist Dr. Becky Bowels [facility] on 05/11/2022. This over-read does not include interpretation of cardiac or coronary anatomy or pathology. The coronary calcium  score/coronary CTA interpretation by the cardiologist is attached.  COMPARISON:  Coronary calcium  score dated Oct 26, 2021.  FINDINGS: Vascular: There are no significant non-cardiac vascular findings.  Mediastinum/Nodes: There are no enlarged lymph nodes.The visualized esophagus demonstrates no significant findings.  Lungs/Pleura: Clear lungs. No pneumothorax or pleural effusion.  Upper abdomen: No acute abnormality.  Musculoskeletal/Chest wall: No chest wall abnormality. No acute or significant osseous findings.  IMPRESSION: 1. No significant extracardiac findings.   Electronically Signed By: William Mcmillan M.D. On: 05/11/2022 16:52  Narrative HISTORY: Exercise intolerance  EXAM: Cardiac/Coronary  CT  TECHNIQUE: The patient was scanned on a [employer].  PROTOCOL: A 120 kV prospective  scan was triggered in the descending thoracic aorta at 111 HU's. Axial non-contrast 3  mm slices were carried out through the heart. The data set was analyzed on a dedicated work station and scored using the Agatston method. Gantry rotation speed was 250 msecs and collimation was .6 mm. Beta blockade and 0.8 mg of sl NTG was given. The 3D data set was reconstructed in 5% intervals of the 35-75 % of the R-R cycle. Systolic and diastolic phases were analyzed on a dedicated work station using MPR, MIP and VRT modes. The patient received contrast: 100mL OMNIPAQUE  IOHEXOL  350 MG/ML SOLN.  FINDINGS: Image quality: Good  Noise artifact is: Limited  Coronary calcium  score is 567, which places the patient in the 94th percentile for age and sex matched control.  Coronary arteries: Normal coronary origins.  Right dominance.  Right Coronary Artery: Serial mild mixed atherosclerotic plaque in the distal RCA, 25-49% stenosis. At the bifurcation of the PDA and PL, there is a moderate calcified plaque in the distal RCA, 50-69% stenosis. Moderate calcified plaque in the proximal PDA, 50-69% stenosis.  Left Main Coronary Artery: Mild mixed atherosclerotic plaque in the distal LM, 25-49% stenosis.  Left Anterior Descending Coronary Artery: Moderate proximal LAD mixed atherosclerotic plaque, 50-69% stenosis. This transitions to a mild stenosis but with positive remodeling and low attenuation plaque, vulnerable plaque characteristics, 25-49% stenosis. Mid LAD bridging to a depth of 3 mm, 28 mm long segment. High branching small caliber first diagonal with severe stenosis proximally with calcified plaque, 70-99% stenosis. Vessel challenging to visualize due to size.  Left Circumflex Artery: Minimal mixed atherosclerotic plaque in the proximal LCx, <25% stenosis. Moderate atherosclerotic plaque in the mid LCx, 50-69% stenosis. High branching first OM1 with severe proximal stenosis with mixed atherosclerotic plaque, 70-99% stenosis. Vessel is 2.2 mm distal to the lesion.  Aorta:  Normal size, 34 mm at the mid ascending aorta (level of the PA bifurcation) measured double oblique. Trivial root calcifications. No dissection.  Aortic Valve: Trivial calcifications. AV calcium  score 3  Other findings:  Normal pulmonary vein drainage into the left atrium.  Normal left atrial appendage without thrombus.  Normal size of the pulmonary artery.  Please see separate report from [facility] for non-cardiac findings.  IMPRESSION: 1. Severe CAD in the proximal high branching OM1 and likely in the high branching first diagonal artery, 70-99% stenosis, CADRADS 4. CT FFR will be performed and reported separately.  2. Otherwise, moderate CAD in the proximal LAD, mid LCx, and distal RCA. There is vulnerable plaque characteristics in the proximal LAD: Low attenuation plaque and positive remodeling are seen.  3. Coronary calcium  score is 567, which places the patient in the 94th percentile for age and sex matched control.  4. Normal coronary origins with right dominance.  RECOMMENDATIONS: CAD-RADS 4 Severe stenosis. (70-99% or > 50% left main). Cardiac catheterization or CT FFR is recommended. Consider symptom-guided anti-ischemic pharmacotherapy as well as risk factor modification per guideline directed care.  Electronically Signed: By: Grady Lawman M.D. On: 05/11/2022 16:17   CT SCANS  CT CARDIAC SCORING (SELF PAY ONLY) 10/26/2021  Addendum 10/28/2021  5:56 AM ADDENDUM REPORT: 10/28/2021 05:54  CLINICAL DATA:  68M for cardiovascular disease risk stratification  EXAM: Coronary Calcium  Score  TECHNIQUE: A gated, non-contrast computed tomography scan of the heart was performed using 3mm slice thickness. Axial images were analyzed on a dedicated workstation. Calcium  scoring of the coronary arteries was performed using the Agatston method.  FINDINGS: Coronary arteries:  Normal origins.  Coronary Calcium  Score:  Left main: 127  Left anterior  descending artery: 149  Left circumflex artery: 0.925  Right coronary artery: 273  Total: 551  Percentile: 94th  Pericardium: Normal.  Ascending Aorta: Normal caliber.  Aortic atherosclerosis.  Non-cardiac: See separate report from [facility].  IMPRESSION: Coronary calcium  score of 551. This was 94th percentile for age-, race-, and sex-matched controls.  Aortic atherosclerosis.  RECOMMENDATIONS: Coronary artery calcium  (CAC) score is a strong predictor of incident coronary heart disease (CHD) and provides predictive information beyond traditional risk factors. CAC scoring is reasonable to use in the decision to withhold, postpone, or initiate statin therapy in intermediate-risk or selected borderline-risk asymptomatic adults (age 67-75 years and LDL-C >=70 to <190 mg/dL) who do not have diabetes or established atherosclerotic cardiovascular disease (ASCVD).* In intermediate-risk (10-year ASCVD risk >=7.5% to <20%) adults or selected borderline-risk (10-year ASCVD risk >=5% to <7.5%) adults in whom a CAC score is measured for the purpose of making a treatment decision the following recommendations have been made:  If CAC=0, it is reasonable to withhold statin therapy and reassess in 5 to 10 years, as long as higher risk conditions are absent (diabetes mellitus, family history of premature CHD in first degree relatives (males <55 years; females <65 years), cigarette smoking, or LDL >=190 mg/dL).  If CAC is 1 to 99, it is reasonable to initiate statin therapy for patients >=38 years of age.  If CAC is >=100 or >=75th percentile, it is reasonable to initiate statin therapy at any age.  Cardiology referral should be considered for patients with CAC scores >=400 or >=75th percentile.  *2018 AHA/ACC/AACVPR/AAPA/ABC/ACPM/ADA/AGS/APhA/ASPC/NLA/PCNA Guideline on the Management of Blood Cholesterol: A Report of the American College of Cardiology/American Heart  Association Task Force on Clinical Practice Guidelines. J Am Coll Cardiol. 2019;73(24):3168-3209.  Maudine Sos, MD   Electronically Signed By: Maudine Sos M.D. On: 10/28/2021 05:54  Narrative CLINICAL DATA:  This over-read does not include interpretation of cardiac or coronary anatomy or pathology. The interpretation by the cardiologist is attached.  COMPARISON:  09/08/2016.  FINDINGS: Vascular: None.  Mediastinum/Nodes: None.  Lungs/Pleura: None.  Upper Abdomen: None.  Musculoskeletal: None.  IMPRESSION: No acute extracardiac findings.  Electronically Signed: By: Shearon Denis M.D. On: 10/26/2021 14:24   CT SCANS  CT CARDIAC SCORING (SELF PAY ONLY) 09/08/2016  Addendum 09/09/2016  8:41 AM ADDENDUM REPORT: 09/09/2016 08:38  EXAM: OVER-READ INTERPRETATION  CT CHEST  The following report is an over-read performed by radiologist Dr. Babs Bolster [facility] on 09/09/2016. This over-read does not include interpretation of cardiac or coronary anatomy or pathology. The coronary calcium  score interpretation by the cardiologist is attached.  COMPARISON:  Chest CT 11/24/2012.  FINDINGS: Within the visualized portions of the thorax there are no suspicious appearing pulmonary nodules or masses, there is no acute consolidative airspace disease, no pleural effusions, no pneumothorax and no lymphadenopathy. Visualized portions of the upper abdomen are unremarkable. There are no aggressive appearing lytic or blastic lesions noted in the visualized portions of the skeleton.  IMPRESSION: No significant incidental noncardiac findings are noted.   Electronically Signed By: Alexandria Angel M.D. On: 09/09/2016 08:38  Narrative CLINICAL DATA:  Risk stratification  EXAM: Coronary Calcium  Score  TECHNIQUE: The patient was scanned on a Siemens Somatom 64 slice scanner. Axial non-contrast 3 mm slices were carried out through the  heart. The data set was analyzed on a dedicated work station and scored using the Agatson method.  FINDINGS: Non-cardiac: No significant non cardiac findings on limited lung and soft tissue windows. See separate report from [facility].  Ascending Aorta:  3.5 cm mild atherosclerosis in aortic valve also  Pericardium: Normal  Coronary arteries: Calcium  detected in distal LM, proximal LAD and distal RCA  IMPRESSION: Coronary calcium  score of 139. This was 75 th percentile for age and sex matched control.  Janelle Mediate  Electronically Signed: By: Janelle Mediate M.D. On: 09/08/2016 17:01     ______________________________________________________________________________________________      Risk Assessment/Calculations:             Physical Exam:   VS:  BP (!) 110/58 (BP Location: Left Arm, Patient Position: Sitting)   Pulse (!) 52   Ht 5\' 9"  (1.753 m)   Wt 152 lb 6.4 oz (69.1 kg)   SpO2 99%   BMI 22.51 kg/m    Wt Readings from Last 3 Encounters:  11/08/23 152 lb 6.4 oz (69.1 kg)  06/25/22 150 lb (68 kg)  06/07/22 150 lb (68 kg)    GEN: Well nourished, well developed in no acute distress NECK: No JVD; No carotid bruits CARDIAC: RRR, no murmurs, rubs, gallops RESPIRATORY:  Clear to auscultation without rales, wheezing or rhonchi  ABDOMEN: Soft, non-tender, non-distended EXTREMITIES:  No edema; No deformity   ASSESSMENT AND PLAN: .    Preop - pending colonoscopy. Exercise tolerance >4 METS. Per AHA/ACC guidelines, he is deemed acceptable risk for the planned procedure without additional cardiovascular testing. Will route to surgical team so they are aware.    CAD - Stable with no anginal symptoms. No indication for ischemic evaluation. 08/2022 LDL 42.  GDMT rosuvastatin  40mg  daily, zetia  10mg  daily. No beta blocker due to baseline bradycardia. Recommend aiming for 150 minutes of moderate intensity activity per week and following a heart healthy diet.    HLD,  LDL goal <55 - Continue Rosuvastatin  40mg  daily, zetia  10mg  daily. Refills provided.  HTN - BP well controlled. Continue current antihypertensive regimen Amlodipine  5mg  daily       Dispo: follow up in 1 year  Signed, Clearnce Curia, NP

## 2023-11-10 DIAGNOSIS — K5730 Diverticulosis of large intestine without perforation or abscess without bleeding: Secondary | ICD-10-CM | POA: Diagnosis not present

## 2023-11-10 DIAGNOSIS — Z1211 Encounter for screening for malignant neoplasm of colon: Secondary | ICD-10-CM | POA: Diagnosis not present

## 2023-11-10 DIAGNOSIS — Z800 Family history of malignant neoplasm of digestive organs: Secondary | ICD-10-CM | POA: Diagnosis not present

## 2023-11-10 DIAGNOSIS — D122 Benign neoplasm of ascending colon: Secondary | ICD-10-CM | POA: Diagnosis not present

## 2023-12-30 ENCOUNTER — Other Ambulatory Visit: Payer: Self-pay | Admitting: Nurse Practitioner

## 2023-12-30 ENCOUNTER — Ambulatory Visit: Admitting: Internal Medicine

## 2024-01-16 IMAGING — CT CT CARDIAC CORONARY ARTERY CALCIUM SCORE
3 series · 14 of 20 positions shown, 16 images · non-contrast
Comparison: 09/08/2016.
COMPARISON: 09/08/2016.

Addendum:
CLINICAL DATA: This over-read does not include interpretation of
cardiac or coronary anatomy or pathology. The interpretation by the
cardiologist is attached.
CLINICAL DATA: 60M for cardiovascular disease risk stratification

EXAM:
Coronary Calcium Score
TECHNIQUE: A gated, non-contrast computed tomography scan of the heart was
performed using 3mm slice thickness. Axial images were analyzed on a
dedicated workstation. Calcium scoring of the coronary arteries was
performed using the Agatston method.

[Series 2: cascseq 2.0 sa36 70% (id) · axial · 0.39mm/px · z∈[-273,-171]mm · 4 of 85 slices shown]
[im 17/85  vessel]
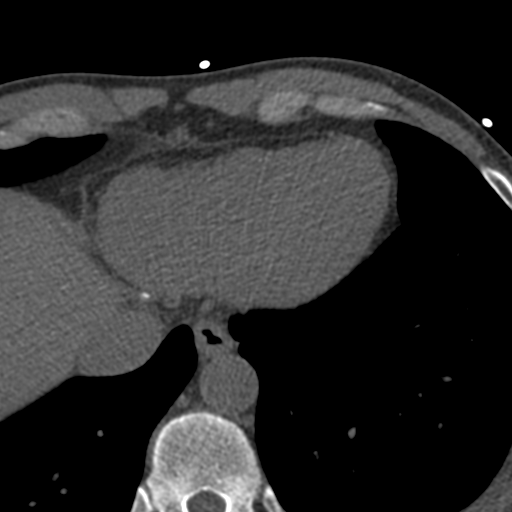
[im 34/85  vessel]
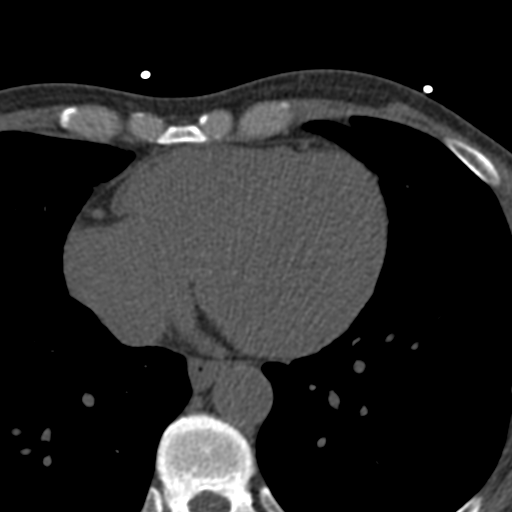
[im 51/85  vessel]
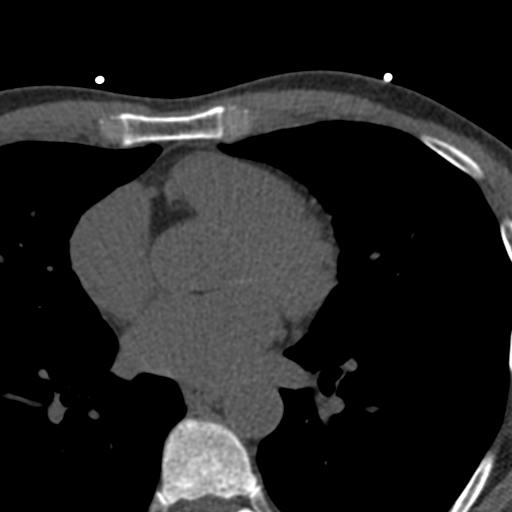
[im 68/85  vessel]
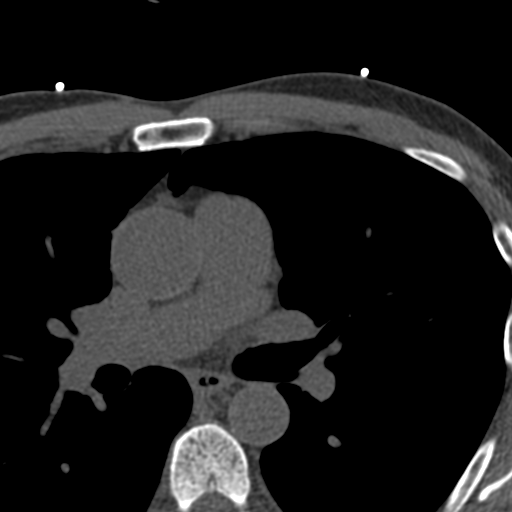

[Series 3: cascseq 2.0 bf37 st · axial · 0.69mm/px · z∈[-277,-165]mm · 5 of 85 slices shown, 7 images]
[im 15/85  vessel]
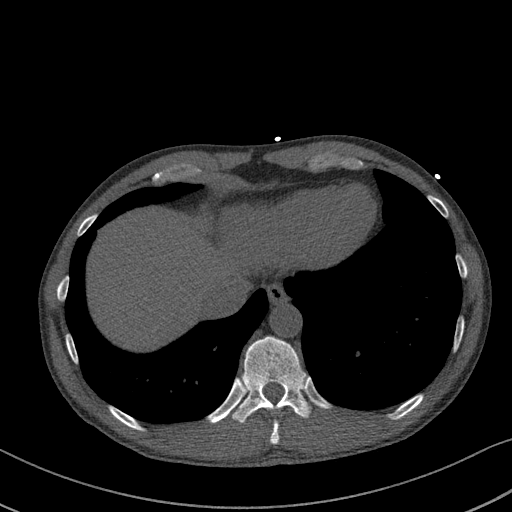
[im 15/85  lung]
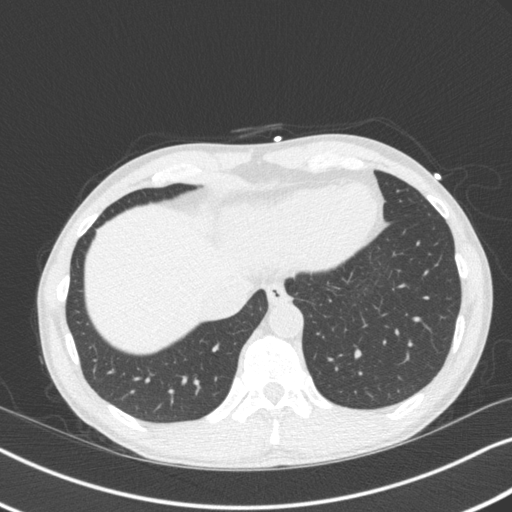
[im 29/85  vessel]
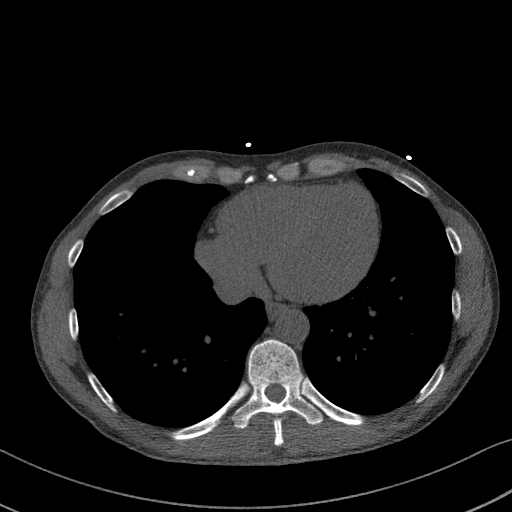
[im 43/85  vessel]
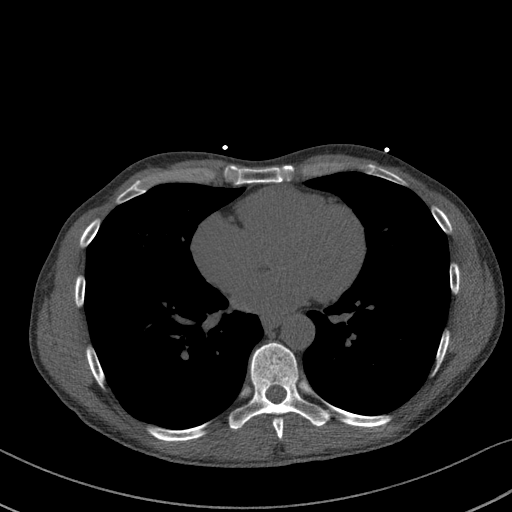
[im 57/85  vessel]
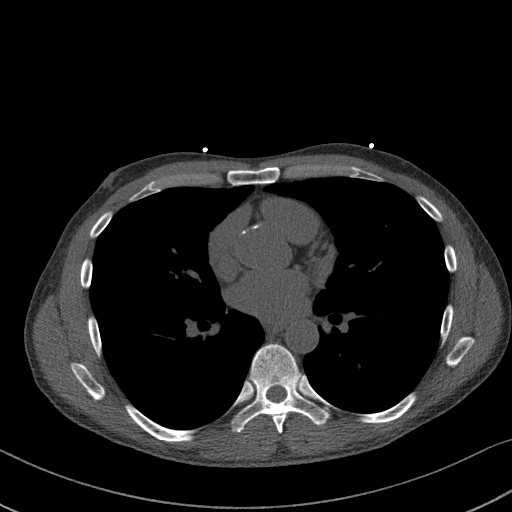
[im 71/85  vessel]
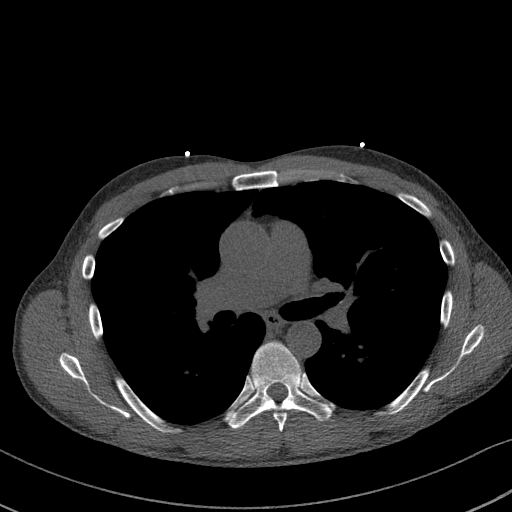
[im 71/85  lung]
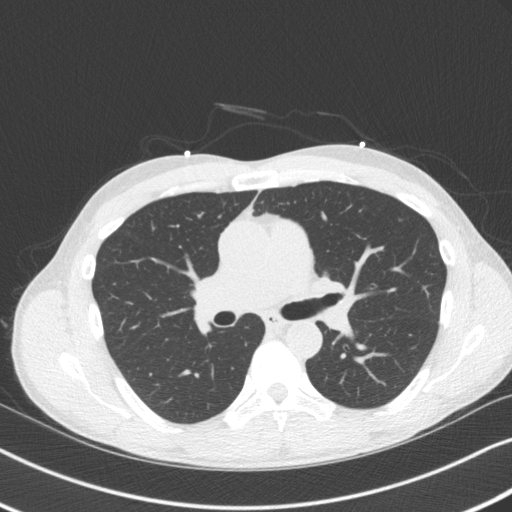

[Series 4: cascseq 2.0 br59 lung · axial · 0.69mm/px · z∈[-277,-165]mm · 5 of 85 slices shown]
[im 15/85  lung]
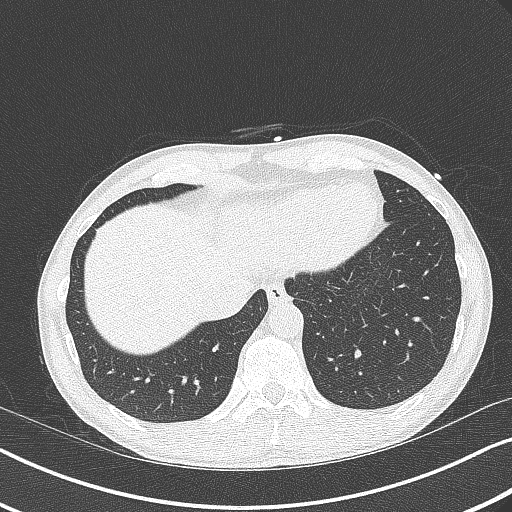
[im 29/85  lung]
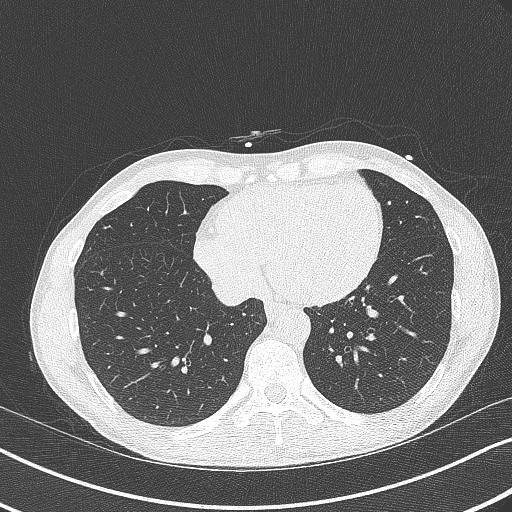
[im 43/85  lung]
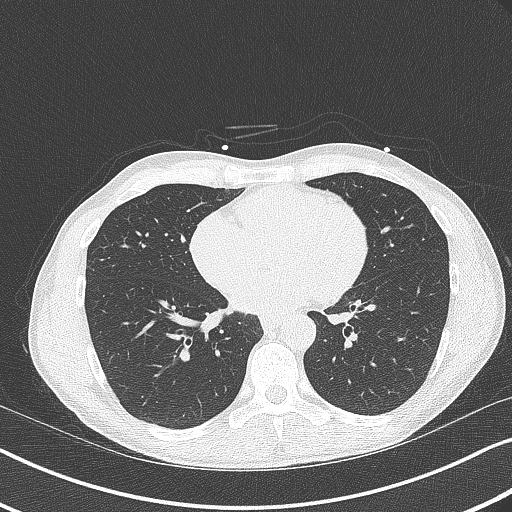
[im 57/85  lung]
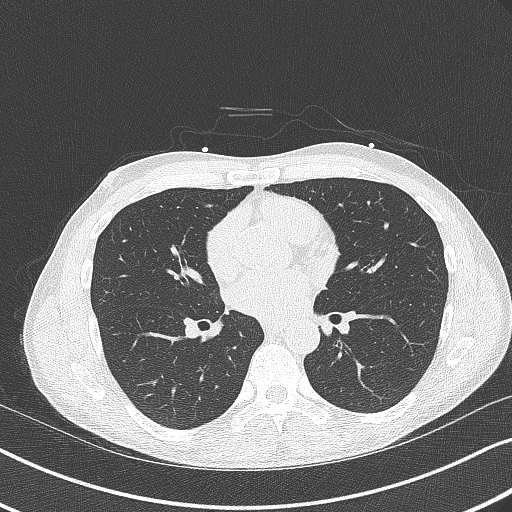
[im 71/85  lung]
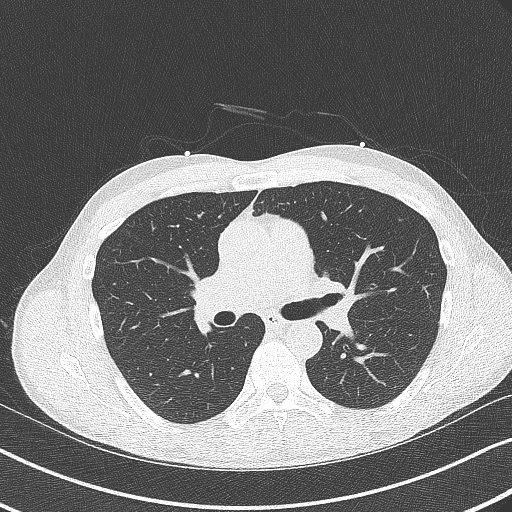

[14 of 20 positions shown; findings below may reference images not displayed]

FINDINGS: Vascular: None.

Mediastinum/Nodes: None.

Lungs/Pleura: None.

Upper Abdomen: None.

Musculoskeletal: None.
IMPRESSION: No acute extracardiac findings.
FINDINGS: Coronary arteries: Normal origins.

Coronary Calcium Score:

Left main: 127

Left anterior descending artery: 149

Left circumflex artery:

Right coronary artery: 273

Total: 551

Percentile: 94th

Pericardium: Normal.

Ascending Aorta: Normal caliber.  Aortic atherosclerosis.

Non-cardiac: See separate report from [REDACTED].
IMPRESSION: Coronary calcium score of 551. This was 94th percentile for age-,
race-, and sex-matched controls.

Aortic atherosclerosis.



If CAC=0, it is reasonable to withhold statin therapy and reassess
in 5 to 10 years, as long as higher risk conditions are absent
(diabetes mellitus, family history of premature CHD in first degree
relatives (males <55 years; females <65 years), cigarette smoking,
or LDL >=190 mg/dL).

If CAC is 1 to 99, it is reasonable to initiate statin therapy for
patients >=55 years of age.

If CAC is >=100 or >=75th percentile, it is reasonable to initiate
statin therapy at any age.

Cardiology referral should be considered for patients with CAC
scores >=400 or >=75th percentile.

*5408 AHA/ACC/AACVPR/AAPA/ABC/MARINE/[place]/MOTZ/Mamanet/RIGAUD/EVERETT/GIORGI
Guideline on the Management of Blood Cholesterol: A Report of the
American College of Cardiology/American Heart Association Task Force
on Clinical Practice Guidelines. J Am Coll Cardiol.
3553;73(24):1132-1963.

*** End of Addendum ***
FINDINGS: Vascular: None.

Mediastinum/Nodes: None.

Lungs/Pleura: None.

Upper Abdomen: None.

Musculoskeletal: None.
IMPRESSION: No acute extracardiac findings.
